# Patient Record
Sex: Male | Born: 1950 | Race: White | Hispanic: No | Marital: Married | Smoking: Never smoker
Health system: Southern US, Community
[De-identification: ages and names within clinical notes are randomized; demographics above are authoritative.]

## PROBLEM LIST (undated history)

## (undated) DIAGNOSIS — G40309 Generalized idiopathic epilepsy and epileptic syndromes, not intractable, without status epilepticus: Secondary | ICD-10-CM

## (undated) DIAGNOSIS — M1990 Unspecified osteoarthritis, unspecified site: Secondary | ICD-10-CM

## (undated) DIAGNOSIS — F419 Anxiety disorder, unspecified: Secondary | ICD-10-CM

## (undated) DIAGNOSIS — G4769 Other sleep related movement disorders: Secondary | ICD-10-CM

## (undated) DIAGNOSIS — I491 Atrial premature depolarization: Secondary | ICD-10-CM

## (undated) DIAGNOSIS — I10 Essential (primary) hypertension: Secondary | ICD-10-CM

## (undated) DIAGNOSIS — R569 Unspecified convulsions: Secondary | ICD-10-CM

## (undated) DIAGNOSIS — E669 Obesity, unspecified: Secondary | ICD-10-CM

## (undated) HISTORY — PX: ADENOIDECTOMY: SUR15

## (undated) HISTORY — DX: Essential (primary) hypertension: I10

## (undated) HISTORY — DX: Obesity, unspecified: E66.9

## (undated) HISTORY — DX: Other sleep related movement disorders: G47.69

## (undated) HISTORY — PX: APPENDECTOMY: SHX54

## (undated) HISTORY — DX: Generalized idiopathic epilepsy and epileptic syndromes, not intractable, without status epilepticus: G40.309

## (undated) HISTORY — PX: HIP ARTHROPLASTY: SHX981

## (undated) HISTORY — PX: TONSILLECTOMY: SUR1361

---

## 1987-05-19 HISTORY — PX: CHOLECYSTECTOMY: SHX55

## 1999-05-19 HISTORY — PX: LUMBAR FUSION: SHX111

## 1999-05-19 HISTORY — PX: KNEE ARTHROSCOPY: SUR90

## 2008-01-09 ENCOUNTER — Emergency Department (HOSPITAL_COMMUNITY): Admission: EM | Admit: 2008-01-09 | Discharge: 2008-01-09 | Payer: Self-pay | Admitting: Emergency Medicine

## 2008-01-14 ENCOUNTER — Encounter: Admission: RE | Admit: 2008-01-14 | Discharge: 2008-01-14 | Payer: Self-pay | Admitting: Specialist

## 2008-04-10 ENCOUNTER — Encounter: Admission: RE | Admit: 2008-04-10 | Discharge: 2008-04-10 | Payer: Self-pay | Admitting: Gastroenterology

## 2011-11-16 ENCOUNTER — Emergency Department (HOSPITAL_COMMUNITY): Payer: PRIVATE HEALTH INSURANCE

## 2011-11-16 ENCOUNTER — Emergency Department (HOSPITAL_COMMUNITY)
Admission: EM | Admit: 2011-11-16 | Discharge: 2011-11-16 | Disposition: A | Discharge status: Home / Self Care | Payer: PRIVATE HEALTH INSURANCE | Attending: Emergency Medicine | Admitting: Emergency Medicine

## 2011-11-16 ENCOUNTER — Encounter (HOSPITAL_COMMUNITY): Payer: Self-pay

## 2011-11-16 DIAGNOSIS — S40011A Contusion of right shoulder, initial encounter: Secondary | ICD-10-CM

## 2011-11-16 DIAGNOSIS — S7000XA Contusion of unspecified hip, initial encounter: Secondary | ICD-10-CM | POA: Insufficient documentation

## 2011-11-16 DIAGNOSIS — M169 Osteoarthritis of hip, unspecified: Secondary | ICD-10-CM | POA: Insufficient documentation

## 2011-11-16 DIAGNOSIS — W010XXA Fall on same level from slipping, tripping and stumbling without subsequent striking against object, initial encounter: Secondary | ICD-10-CM | POA: Insufficient documentation

## 2011-11-16 DIAGNOSIS — S40019A Contusion of unspecified shoulder, initial encounter: Secondary | ICD-10-CM | POA: Insufficient documentation

## 2011-11-16 DIAGNOSIS — W19XXXA Unspecified fall, initial encounter: Secondary | ICD-10-CM

## 2011-11-16 DIAGNOSIS — M542 Cervicalgia: Secondary | ICD-10-CM | POA: Insufficient documentation

## 2011-11-16 DIAGNOSIS — M1610 Unilateral primary osteoarthritis, unspecified hip: Secondary | ICD-10-CM | POA: Insufficient documentation

## 2011-11-16 DIAGNOSIS — S7001XA Contusion of right hip, initial encounter: Secondary | ICD-10-CM

## 2011-11-16 HISTORY — DX: Atrial premature depolarization: I49.1

## 2011-11-16 HISTORY — DX: Unspecified convulsions: R56.9

## 2011-11-16 MED ORDER — OXYCODONE-ACETAMINOPHEN 5-325 MG PO TABS: 1.0000 | ORAL_TABLET | ORAL | Status: AC | PRN

## 2011-11-16 MED ORDER — MORPHINE SULFATE 4 MG/ML IJ SOLN
4.0000 mg | Freq: Once | INTRAMUSCULAR | Status: AC
  Administered 2011-11-16: 4 mg via INTRAVENOUS
  Filled 2011-11-16: qty 1

## 2011-11-16 NOTE — ED Notes (Signed)
Pt was brought in by ambulance S/P slipped and fall with c/o neck pain, rt shoulder pain and rt hip pain. Pt denies hitting his head on the floor. Pt is immobilized on a back board but no C-collar, EMS stated that his neck is wide and shirt to fit one. Pt is A/A/Ox4, skin is warm and dry, respiration is even and unlabored.

## 2011-11-16 NOTE — ED Notes (Signed)
Pt ambulated fine at first was holding the wall then I had him let go and he ambulated fine. York Spaniel was a little dizzy but that was from meds. 1:51pm JG.

## 2011-11-16 NOTE — Discharge Instructions (Signed)
Contusion A contusion is a deep bruise. Contusions are the result of an injury that caused bleeding under the skin. The contusion may turn blue, purple, or yellow. Minor injuries will give you a painless contusion, but more severe contusions may stay painful and swollen for a few weeks.  CAUSES  A contusion is usually caused by a blow, trauma, or direct force to an area of the body. SYMPTOMS   Swelling and redness of the injured area.   Bruising of the injured area.   Tenderness and soreness of the injured area.   Pain.  DIAGNOSIS  The diagnosis can be made by taking a history and physical exam. An X-ray, CT scan, or MRI may be needed to determine if there were any associated injuries, such as fractures. TREATMENT  Specific treatment will depend on what area of the body was injured. In general, the best treatment for a contusion is resting, icing, elevating, and applying cold compresses to the injured area. Over-the-counter medicines may also be recommended for pain control. Ask your caregiver what the best treatment is for your contusion. HOME CARE INSTRUCTIONS   Put ice on the injured area.   Put ice in a plastic bag.   Place a towel between your skin and the bag.   Leave the ice on for 15 to 20 minutes, 3 to 4 times a day.   Only take over-the-counter or prescription medicines for pain, discomfort, or fever as directed by your caregiver. Your caregiver may recommend avoiding anti-inflammatory medicines (aspirin, ibuprofen, and naproxen) for 48 hours because these medicines may increase bruising.   Rest the injured area.   If possible, elevate the injured area to reduce swelling.  SEEK IMMEDIATE MEDICAL CARE IF:   You have increased bruising or swelling.   You have pain that is getting worse.   Your swelling or pain is not relieved with medicines.  MAKE SURE YOU:   Understand these instructions.   Will watch your condition.   Will get help right away if you are not  doing well or get worse.  Document Released: 02/11/2005 Document Revised: 04/23/2011 Document Reviewed: 03/09/2011 [facility] Patient Information 2012 [place], [place].  Home Safety and Preventing Falls Falls are a leading cause of injury and while they affect all age groups, falls have greater short-term and long-term impact on older age groups. However, falls should not be a part of life or aging. It is possible for individuals and their families to use preventive measures to significantly decrease the likelihood that anyone, especially an older adult, will fall. There are many simple measures which can make your home safer with respect to preventing falls. The following actions can help reduce falls among all members of your family and are especially important as you age, when your balance, lower limb strength, coordination, and eyesight may be declining. The use of preventive measures will help to reduce you and your family's risk of falls and serious medical consequences. OUTDOORS  Repair cracks and edges of walkways and driveways.   Remove high doorway thresholds and trim shrubbery on the main path into your home.   Ensure there is good outside lighting at main entrances and along main walkways.   Clear walkways of tools, rocks, debris, and clutter.   Check that handrails are not broken and are securely fastened. Both sides of steps should have handrails.   In the garage, be attentive to and clean up grease or oil spills on the cement. This can make the surface  extremely slippery.   In winter, have leaves, snow, and ice cleared regularly.   Use sand or salt on walkways during winter months.  BATHROOM  Install grab bars by the toilet and in the tub and shower.   Use non-skid mats or decals in the tub or shower.   If unable to easily stand unsupported while showering, place a plastic non slip stool in the shower to sit on when needed.   Install night lights.   Keep floors dry and  clean up all water on the floor immediately.   Remove soap buildup in tub or shower on a regular basis.   Secure bath mats with non-slip, double-sided rug tape.   Remove tripping hazards from the floors.  BEDROOMS  Install night lights.   Do not use oversized bedding.   Make sure a bedside light is easy to reach.   Keep a telephone by your bedside.   Make sure that you can get in and out of your bed easily.   Have a firm chair, with side arms, to use for getting dressed.   Remove clutter from around closets.   Store clothing, bed coverings, and other household items where you can reach them comfortably.   Remove tripping hazards from the floor.  LIVING AREAS AND STAIRWAYS  Turn on lights to avoid having to walk through dark areas.   Keep lighting uniform in each room. Place brighter lightbulbs in darker areas, including stairways.   Replace lightbulbs that burn out in stairways immediately.   Arrange furniture to provide for clear pathways.   Keep furniture in the same place.   Eliminate or tape down electrical cables in high traffic areas.   Place handrails on both sides of stairways. Use handrails when going up or down stairs.   Most falls occur on the top or bottom 3 steps.   Fix any loose handrails. Make sure handrails on both sides of the stairways are as long as the stairs.   Remove all walkway obstacles.   Coil or tape electrical cords off to the side of walking areas and out of the way. If using many extension cords, have an electrician put in a new wall outlet to reduce or eliminate them.   Make sure spills are cleaned up quickly and allow time for drying before walking on freshly cleaned floors.   Firmly attach carpet with non-skid or two-sided tape.   Keep frequently used items within easy reach.   Remove tripping hazards such as throw rugs and clutter in walkways. Never leave objects on stairs.   Get rid of throw rugs elsewhere if possible.    Eliminate uneven floor surfaces.   Make sure couches and chairs are easy to get into and out of.   Check carpeting to make sure it is firmly attached along stairs.   Make repairs to worn or loose carpet promptly.   Select a carpet pattern that does not visually hide the edge of steps.   Avoid placing throw rugs or scatter rugs at the top or bottom of stairways, or properly secure with carpet tape to prevent slippage.   Have an electrician put in a light switch at the top and bottom of the stairs.   Get light switches that glow.   Avoid the following practices: hurrying, inattention, obscured vision, carrying large loads, and wearing slip-on shoes.   Be aware of all pets.  KITCHEN  Place items that are used frequently, such as dishes and food, within  easy reach.   Keep handles on pots and pans toward the center of the stove. Use back burners when possible.   Make sure spills are cleaned up quickly and allow time for drying.   Avoid walking on wet floors.   Avoid hot utensils and knives.   Position shelves so they are not too high or low.   Place commonly used objects within easy reach.   If necessary, use a sturdy step stool with a grab bar when reaching.   Make sure electrical cables are out of the way.   Do not use floor polish or wax that makes floors slippery.  OTHER HOME FALL PREVENTION STRATEGIES  Wear low heel or rubber sole shoes that are supportive and fit well.   Wear closed toe shoes.   Know and watch for side effects of medications. Have your caregiver or pharmacist look at all your medicines, even over-the-counter medicines. Some medicines can make you sleepy or dizzy.   Exercise regularly. Exercise makes you stronger and improves your balance and coordination.   Limit use of alcohol.   Use eyeglasses if necessary and keep them clean. Have your vision checked every year.   Organize your household in a manner that minimizes the need to walk  distances when hurried, or go up and down stairs unnecessarily. For example, have a phone placed on at least each floor of your home. If possible, have a phone beside each sitting or lying area where you spend the most time at home. Keep emergency numbers posted at all phones.   Use non-skid floor wax.   When using a ladder, make sure:   The base is firm.   All ladder feet are on level ground.   The ladder is angled against the wall properly.   When climbing a ladder, face the ladder and hold the ladder rungs firmly.   If reaching, always keep your hips and body weight centered between the rails.   When using a stepladder, make sure it is fully opened and both spreaders are firmly locked.   Do not climb a closed stepladder.   Avoid climbing beyond the second step from the top of a stepladder and the 4th rung from the top of an extension ladder.   Learn and use mobility aids as needed.   Change positions slowly. Arise slowly from sitting and lying positions. Sit on the edge of your bed before getting to your feet.   If you have a history of falls, ask someone to add color or contrast paint or tape to grab bars and handrails in your home.   If you have a history of falls, ask someone to place contrasting color strips on first and last steps.   Install an electrical emergency response system if you need one, and know how to use it.   If you have a medical or other condition that causes you to have limited physical strength, it is important that you reach out to family and friends for occasional help.  FOR CHILDREN:  If young children are in the home, use safety gates. At the top of stairs use screw-mounted gates; use pressure-mounted gates for the bottom of the stairs and doorways between rooms.   Young children should be taught to descend stairs on their stomachs, feet first, and later using the handrail.   Keep drawers fully closed to prevent them from being climbed on or pulled  out entirely.   Move chairs, cribs, beds and other furniture  away from windows.   Consider installing window guards on windows ground floor and up, unless they are emergency fire exits. Make sure they have easy release mechanisms.   Consider installing special locks that only allow the window to be opened to a certain height.   Never rely on window screens to prevent falls.   Never leave babies alone on changing tables, beds or sofas. Use a changing table that has a restraining strap.   When a child can pull to a standing position, the crib mattress should be adjusted to its lowest position. There should be at least 26 inches between the top rails of the crib drop side and the mattress. Toys, bumper pads, and other objects that can be used as steps to climb out should be removed from the crib.   On bunk beds never allow a child under age 21 to sleep on the top bunk. For older children, if the upper bunk is not against a wall, use guard rails on both sides. No matter how old a child is, keep the guard rails in place on the top bunk since children roll during sleep. Do not permit horseplay on bunks.   Grass and soil surfaces beneath backyard playground equipment should be replaced with hardwood chips, shredded wood mulch, sand, pea gravel, rubber, crushed stone, or another safer material at depths of at least 9 to 12 inches.   When riding bikes or using skates, skateboards, skis, or snowboards, require children to wear helmets. Look for those that have stickers stating that they meet or exceed safety standards.   Vertical posts or pickets in deck, balcony, and stairway railings should be no more than 3 1/2 inches apart if a young baby will have access to the area. The space between horizontal rails or bars, and between the floor and the first horizontal rail or bar, should be no more than 3 1/2 inches.  Document Released: 04/24/2002 Document Revised: 04/23/2011 Document Reviewed:  02/21/2009 [facility] Patient Information 2012 [place], [place].  Walker Use HOW TO TELL IF A WALKER IS THE RIGHT SIZE  With your arms hanging at your sides, the walker handles should be at wrist level. If you cannot find the exact fit, choose the height that is most comfortable.   If you have been instructed to not place weight on one of your legs, you may feel more comfortable with a shorter height. If you are using the walker for balance, you may prefer a taller height.   Adjust the height by using the push buttons on the legs of your walker.   In rest position, the back leg of the walkers should be no further ahead than your toes. With your hands resting on the grips, your elbows should be slightly bent at about a 30 degree angle.   Ask your physical therapist or caregiver if you have any concerns.  HOW TO USE A STANDARD WALKER (NO WHEELS)  Pick your walker up (do not slide your walker) and place it one step length in front of you. The back legs of the walker should be no further ahead than your toes. You should not feel like you need to lean forward to keep your hands on the grips. As you set the walker down, make sure all 4 leg tips contact the ground at the same time.   Hold onto the walker for support and step forward with your weaker leg into the middle of the walker. Follow the weight bearing instructions your  caregiver has given you.   Push down with your hands and step forward with your stronger leg.   Be careful not to let the walker get too far ahead of you as you walk.   Repeat the process for each step.  HOW TO USE A FRONT-WHEELED WALKER  Slide your walker forward. The back legs of the walker should be no further ahead than your toes. You should not feel like you need to lean forward to keep your hands on the grips.   Hold onto the walker for support and step forward with your weaker leg into the middle of the walker. Follow the weight bearing instructions your caregiver  has given you.   Push down with your hands and step forward with your stronger leg.   Be careful not to let the walker get too far ahead of you as you walk.   Repeat the process for each step.   If your walker does not glide well over carpet, consider cutting an "x" in 2 old tennis balls and placing them over the back legs of your walker.  STANDING UP FROM A CHAIR WITH ARMRESTS  It is best to sit in a firm chair with armrests.   Position your walker directly in front of your chair. Do not pull on the walker when standing up. It is too unstable to support weight when pulled on.   Slide forward in the chair, with your weaker leg ahead and stronger leg bent near the chair.   Lean forward and push up from your chair with both hands on the armrests. Straighten your stronger leg, rising to standing. Do not pull yourself up from the walker. This may cause it to tip.   When you feel steady on your feet, carefully move one hand at a time to the walker.   Stand for a few seconds to stabilize your balance before you start to walk.  STANDING UP FROM A CHAIR WITHOUT ARMRESTS  It is best to sit in a firm chair. A low seat or an overstuffed chair or sofa is hard to get out of.   Place the walker in front of you. Do not pull on the walker when coming to a standing position.   Slide forward in the chair, with your weaker leg ahead and stronger leg bent near the chair.   Push down on the chair seat with the hand opposite your weaker leg. Keep your other hand on the center of the walker's crossbar.   Stand, steady your balance, and place your hands on the walker handgrips.  SITTING DOWN  Always back up toward your chair, using your walker, until you feel the back of your legs touch the chair.   If the chair has armrests, carefully reach back to put your hands on the armrests, and slowly lower your weight.   If the chair does not have armrests, consider backing up to the side of the chair. You can  then hold onto the back of the chair and the front of the seat to slowly lower yourself.   You should never feel like you are falling into your chair.  USING A WALKER ON STEPS   Before attempting to use your walker on steps, practice with your physical therapist.   If you are going up a step wide enough to accommodate the entire walker and yourself:   First, place the walker up on the step.   Second, get your feet as close to the  step as you can.   Third, press down on the walker with your hands as you step up with your stronger leg. Then step up with your weaker leg.   If you are going down a step wide enough to accommodate the entire walker and yourself:   First, place the walker down on the step.   Second, hold onto the walker as you step down with your weaker leg. Then step down with your stronger leg.   If you are going up more than 1 step and have a railing:   First, turn the walker sideways, so the opening is facing in toward you.   Second, place the front 2 legs of the walker on the first step. These front legs should be positioned at the base of the next step.   Third, test the steadiness of the walker. It should feel sturdy when you press down on the handgrip that is facing the top of the steps.   Finally, placing your weight on the railing and the walker, step up with your stronger leg first. Then step up with your weaker leg.   If you are going down more than 1 step and have a railing:   First, turn the walker sideways, so the opening is facing in toward you.   Second, place the front 2 legs of the walker down on the first step. When possible, the back legs of the walker should be positioned at the base of the previous step.   Third, test the steadiness of the walker. It should feel sturdy when you press down on the handgrip that is facing the top of the steps.   Finally, placing your weight on the railing and the walker, step down with your weaker leg first. Then  step down with your stronger leg.   Be sure to check the sturdiness of the walker before each step.   Make sure you have good rubber tips on the legs of your walker to prevent it from slipping.  Document Released: 05/04/2005 Document Revised: 04/23/2011 Document Reviewed: 11/11/2010 [facility] Patient Information 2012 [place], [place].  Acetaminophen; Oxycodone tablets What is this medicine? ACETAMINOPHEN; OXYCODONE (a set a MEE noe fen; ox i KOE done) is a pain reliever. It is used to treat mild to moderate pain. This medicine may be used for other purposes; ask your health care provider or pharmacist if you have questions. What should I tell my health care provider before I take this medicine? They need to know if you have any of these conditions: -brain tumor -Crohn's disease, inflammatory bowel disease, or ulcerative colitis -drink more than 3 alcohol containing drinks per day -drug abuse or addiction -head injury -heart or circulation problems -kidney disease or problems going to the bathroom -liver disease -lung disease, asthma, or breathing problems -an unusual or allergic reaction to acetaminophen, oxycodone, other opioid analgesics, other medicines, foods, dyes, or preservatives -pregnant or trying to get pregnant -breast-feeding How should I use this medicine? Take this medicine by mouth with a full glass of water. Follow the directions on the prescription label. Take your medicine at regular intervals. Do not take your medicine more often than directed. Talk to your pediatrician regarding the use of this medicine in children. Special care may be needed. Patients over 35 years old may have a stronger reaction and need a smaller dose. Overdosage: If you think you have taken too much of this medicine contact a poison control center or emergency room at once. NOTE:  This medicine is only for you. Do not share this medicine with others. What if I miss a dose? If you miss a dose,  take it as soon as you can. If it is almost time for your next dose, take only that dose. Do not take double or extra doses. What may interact with this medicine? -alcohol or medicines that contain alcohol -antihistamines -barbiturates like amobarbital, butalbital, butabarbital, methohexital, pentobarbital, phenobarbital, thiopental, and secobarbital -benztropine -drugs for bladder problems like solifenacin, trospium, oxybutynin, tolterodine, hyoscyamine, and methscopolamine -drugs for breathing problems like ipratropium and tiotropium -drugs for certain stomach or intestine problems like propantheline, homatropine methylbromide, glycopyrrolate, atropine, belladonna, and dicyclomine -general anesthetics like etomidate, ketamine, nitrous oxide, propofol, desflurane, enflurane, halothane, isoflurane, and sevoflurane -medicines for depression, anxiety, or psychotic disturbances -medicines for pain like codeine, morphine, pentazocine, buprenorphine, butorphanol, nalbuphine, tramadol, and propoxyphene -medicines for sleep -muscle relaxants -naltrexone -phenothiazines like perphenazine, thioridazine, chlorpromazine, mesoridazine, fluphenazine, prochlorperazine, promazine, and trifluoperazine -scopolamine -trihexyphenidyl This list may not describe all possible interactions. Give your health care provider a list of all the medicines, herbs, non-prescription drugs, or dietary supplements you use. Also tell them if you smoke, drink alcohol, or use illegal drugs. Some items may interact with your medicine. What should I watch for while using this medicine? Tell your doctor or health care professional if your pain does not go away, if it gets worse, or if you have new or a different type of pain. You may develop tolerance to the medicine. Tolerance means that you will need a higher dose of the medication for pain relief. Tolerance is normal and is expected if you take this medicine for a long time. Do not  suddenly stop taking your medicine because you may develop a severe reaction. Your body becomes used to the medicine. This does NOT mean you are addicted. Addiction is a behavior related to getting and using a drug for a nonmedical reason. If you have pain, you have a medical reason to take pain medicine. Your doctor will tell you how much medicine to take. If your doctor wants you to stop the medicine, the dose will be slowly lowered over time to avoid any side effects. You may get drowsy or dizzy. Do not drive, use machinery, or do anything that needs mental alertness until you know how this medicine affects you. Do not stand or sit up quickly, especially if you are an older patient. This reduces the risk of dizzy or fainting spells. Alcohol may interfere with the effect of this medicine. Avoid alcoholic drinks. The medicine will cause constipation. Try to have a bowel movement at least every 2 to 3 days. If you do not have a bowel movement for 3 days, call your doctor or health care professional. Do not take Tylenol (acetaminophen) or medicines that have acetaminophen with this medicine. Too much acetaminophen can be very dangerous. Many nonprescription medicines contain acetaminophen. Always read the labels carefully to avoid taking more acetaminophen. What side effects may I notice from receiving this medicine? Side effects that you should report to your doctor or health care professional as soon as possible: -allergic reactions like skin rash, itching or hives, swelling of the face, lips, or tongue -breathing difficulties, wheezing -confusion -light headedness or fainting spells -severe stomach pain -yellowing of the skin or the whites of the eyes Side effects that usually do not require medical attention (report to your doctor or health care professional if they continue or are bothersome): -dizziness -drowsiness -nausea -vomiting This  list may not describe all possible side effects. Call  your doctor for medical advice about side effects. You may report side effects to FDA at 1-800-FDA-1088. Where should I keep my medicine? Keep out of the reach of children. This medicine can be abused. Keep your medicine in a safe place to protect it from theft. Do not share this medicine with anyone. Selling or giving away this medicine is dangerous and against the law. Store at room temperature between 20 and 25 degrees C (68 and 77 degrees F). Keep container tightly closed. Protect from light. Flush any unused medicines down the toilet. Do not use the medicine after the expiration date. NOTE: This sheet is a summary. It may not cover all possible information. If you have questions about this medicine, talk to your doctor, pharmacist, or health care provider.  2012, [employer]. (04/02/2008 10:01:21 AM)

## 2011-11-16 NOTE — ED Notes (Signed)
Dr. Glick at bedside.  

## 2011-11-16 NOTE — ED Notes (Signed)
CT of the neck is negative, Dr. Preston Fleeting was made aware, back board was removed.

## 2011-11-16 NOTE — ED Provider Notes (Signed)
History     CSN: [id number]  Arrival date & time 11/16/11  0714   First MD Initiated Contact with Patient 11/16/11 0715      Chief Complaint  Patient presents with  . Fall    (Consider location/radiation/quality/duration/timing/severity/associated sxs/prior treatment) Patient is a 61 y.o. male presenting with fall. The history is provided by the patient.  Fall  He slipped and fell landing on his right side. He is complaining of pain in the right side of his neck, his right shoulder, and his right groin. Pain is moderate to severe and he rates it at 7/10. He did have some numbness in his right arm initially but that has resolved. He was treated by EMS by full spinal immobilization but c-collar could not be applied because of his neck size.  No past medical history on file.  No past surgical history on file.  No family history on file.  History  Substance Use Topics  . Smoking status: Not on file  . Smokeless tobacco: Not on file  . Alcohol Use: Not on file      Review of Systems  All other systems reviewed and are negative.    Allergies  Review of patient's allergies indicates not on file.  Home Medications  No current outpatient prescriptions on file.  There were no vitals taken for this visit.  Physical Exam  Nursing note and vitals reviewed.  61 year old white male on a long spine board. Vital signs are normal. He is obese, but in no acute distress. Head is normocephalic and atraumatic. PERRLA, EOMI. Neck is nontender. Back is nontender. Lungs are clear without rales, wheezes, rhonchi. Heart has regular rate rhythm without murmur. There is no chest wall tenderness. Abdomen is soft, flat, nontender without masses or hepatosplenomegaly. Extremities: There is tenderness palpation in the right shoulder with worse tenderness over the region of the a.c. joint. There is tenderness palpation over the right anterior pelvis and over the right hip with pain on range of motion  of the right leg. Distal neurovascular exam is intact in both right arm and right leg with strong pulses, normal sensation, and prompt capillary refill. No other extremity injuries seen. Skin is warm and dry without rash. Neurologic: Mental status is normal, cranial nerves are intact, there are no motor or sensory deficits.  ED Course  Procedures (including critical care time)  Dg Shoulder Right  11/16/2011  *RADIOLOGY REPORT*  Clinical Data: Recent fall with pain  RIGHT SHOULDER - 2+ VIEW  Comparison: None.  Findings: There is age advanced degenerative joint disease of the right shoulder.  There is loss of joint space, sclerosis, and spurring present digitally from the inferior humeral head and inferior labrum.  No acute fracture is seen.  The right [facility] joint is normally aligned with no abnormality evident.  IMPRESSION: Age advanced degenerative joint disease of the right shoulder.  Original Report Authenticated By: Juline Patch, M.D.   Dg Hip Complete Right  11/16/2011  *RADIOLOGY REPORT*  Clinical Data: Recent fall with pain  RIGHT HIP - COMPLETE 2+ VIEW  Comparison: Right hip films of 03/12/2010  Findings: There has been progression of degenerative joint disease involving the right hip with loss of joint space particularly superiorly.  There is sclerosis, spurring, and subchondral cyst formation present.  No acute fracture is seen.  Minimal degenerative joint disease involves the left hip.  The pelvic rami are intact.  The SI joints appear normal.  Hardware for fusion is noted in  the lower lumbar spine.  There is a linear lucency vertically through the transverse bar of the fixation device overlying the L5 level of questionable significance.  This linear lucency was present on the films of 2011.  IMPRESSION:   Progression of the advanced degenerative joint disease of the right hip.  No acute fracture.  Original Report Authenticated By: Juline Patch, M.D.   Ct Cervical Spine Wo Contrast  11/16/2011   *RADIOLOGY REPORT*  Clinical Data: Posterior neck pain post fall  CT CERVICAL SPINE WITHOUT CONTRAST  Technique:  Multidetector CT imaging of the cervical spine was performed. Multiplanar CT image reconstructions were also generated.  Comparison: 01/09/2008  Findings: Axial images of the cervical spine shows no acute fracture or subluxation.  There is no pneumothorax in visualized lung apices.  Computer processed images shows no acute fracture or subluxation. Again noted disc space flattening with mild anterior and mild posterior spurring at C4-C5 level.  Stable disc space flattening with mild anterior and moderate posterior spurring at C5-C6 level. Minimal disc space flattening at C7-T1 level.  There is mild disc space flattening with mild anterior and minimal posterior spurring at C6-C7 level. No prevertebral soft tissue swelling.  Facet degenerative changes noted at C6 and C7 level.  Cervical airway is patent.  IMPRESSION: No acute fracture or subluxation.  Degenerative changes as described above.  Original Report Authenticated By: Natasha Mead, M.D.   Ct Hip Right Wo Contrast  11/16/2011  *RADIOLOGY REPORT*  Clinical Data: Persistent right hip pain since a fall.  CT OF THE RIGHT HIP WITHOUT CONTRAST  Technique:  Multidetector CT imaging was performed according to the standard protocol. Multiplanar CT image reconstructions were also generated.  Comparison: CT pelvis 04/10/2008.  Plain films of the right hip 03/12/2010 and 11/16/2011.  Findings: No fracture or dislocation is identified.  Since the prior CT scan, there has been marked progression of degenerative disease about the right hip.  There is bone-on-bone joint space narrowing with extensive subchondral sclerosis, cyst formation and osteophytosis.  No hip joint effusion is identified. Milder degree of left hip degenerative disease is present.  Visualized musculature appears normal.  Imaged intrapelvic contents are unremarkable.  IMPRESSION: Negative for  fracture of acute finding.  Severe right hip osteoarthritis.  Original Report Authenticated By: Bernadene Bell. D'ALESSIO, M.D.     1. Fall   2. Contusion of hip, right   3. Contusion of shoulder, right       MDM  Fall with neck, shoulder, and hip injuries. X-rays of been ordered.  X-rays are read as negative for fracture. However, he has extreme pain with any range of motion in his right hip. CT will be obtained to evaluate possible occult fracture.  CT scan is negative for fracture but shows severe degenerative changes. He is able to ambulate but with some difficulty. He is given a prescription for a walker and prescription for Percocet for pain.        Dione Booze, MD 11/16/11 1359

## 2011-12-25 ENCOUNTER — Ambulatory Visit
Admission: RE | Admit: 2011-12-25 | Discharge: 2011-12-25 | Disposition: A | Discharge status: Home / Self Care | Payer: PRIVATE HEALTH INSURANCE | Source: Ambulatory Visit | Attending: Family Medicine | Admitting: Family Medicine

## 2011-12-25 ENCOUNTER — Other Ambulatory Visit: Payer: Self-pay | Admitting: Family Medicine

## 2011-12-25 DIAGNOSIS — Z01818 Encounter for other preprocedural examination: Secondary | ICD-10-CM

## 2012-01-11 MED ORDER — ASPIRIN 81 MG PO CHEW
CHEWABLE_TABLET | ORAL | Status: AC
  Filled 2012-01-11: qty 1

## 2012-01-12 ENCOUNTER — Encounter (HOSPITAL_COMMUNITY): Payer: Self-pay | Admitting: Respiratory Therapy

## 2012-01-14 ENCOUNTER — Inpatient Hospital Stay (HOSPITAL_COMMUNITY): Admission: RE | Admit: 2012-01-14 | Payer: PRIVATE HEALTH INSURANCE | Source: Ambulatory Visit

## 2012-01-22 ENCOUNTER — Encounter (HOSPITAL_COMMUNITY): Admission: RE | Payer: Self-pay | Source: Ambulatory Visit

## 2012-01-22 ENCOUNTER — Ambulatory Visit (HOSPITAL_COMMUNITY)
Admission: RE | Admit: 2012-01-22 | Payer: PRIVATE HEALTH INSURANCE | Source: Ambulatory Visit | Admitting: Orthopedic Surgery

## 2012-01-22 SURGERY — ARTHROPLASTY, SHOULDER, TOTAL
Anesthesia: General | Site: Shoulder | Laterality: Right

## 2012-02-16 NOTE — H&P (Signed)
CC: right shoulder pain HPI: 61 y/o male with worsening right shoulder pain secondary to endstage osteoarthritis. Pt has elected for a hemi arthroplasty to increase function and decrease pain to right shoulder PMH: seizures, anxiety, cardiac arrhythmmias,  Family: CVA, osteoarthritis Social: no alcohol use, non smoker, married Meds: arthrotec, robaxin, percocet Allergies: contrast, penicillin ROS: pain with ros of right shoulder otherwise ros negative PE: alert and appropriate 61 y/o male in no acute distress Cervical spine: full rom cranial nerves 2-12 intact Right shoulder: moderate crepitus with rom, nv intact distally, mild decrease rom due to pain Left upper extremity shows full rom no tenderness No rashes or edema X-rays: right shoulder endstage osteoarthritis Assessment: right shoulder endstage osteoarthritis Plan: right shoulder hemi arthroplasty with CTA head

## 2012-02-17 ENCOUNTER — Encounter (HOSPITAL_COMMUNITY)
Admission: RE | Admit: 2012-02-17 | Discharge: 2012-02-17 | Disposition: A | Discharge status: Home / Self Care | Payer: PRIVATE HEALTH INSURANCE | Source: Ambulatory Visit | Attending: Orthopedic Surgery | Admitting: Orthopedic Surgery

## 2012-02-17 ENCOUNTER — Encounter (HOSPITAL_COMMUNITY): Payer: Self-pay

## 2012-02-17 HISTORY — DX: Anxiety disorder, unspecified: F41.9

## 2012-02-17 HISTORY — DX: Unspecified osteoarthritis, unspecified site: M19.90

## 2012-02-17 LAB — CBC
MCH: 31.5 pg (ref 26.0–34.0)
MCHC: 32.9 g/dL (ref 30.0–36.0)
Platelets: 334 10*3/uL (ref 150–400)
RBC: 4.38 MIL/uL (ref 4.22–5.81)
RDW: 13.7 % (ref 11.5–15.5)

## 2012-02-17 LAB — BASIC METABOLIC PANEL
Calcium: 9.5 mg/dL (ref 8.4–10.5)
GFR calc non Af Amer: >90 mL/min (ref >90)
Glucose, Bld: 112 mg/dL — ABNORMAL HIGH (ref 70–99)
Sodium: 137 mEq/L (ref 135–145)

## 2012-02-17 LAB — TYPE AND SCREEN: ABO/RH(D): A POS

## 2012-02-17 LAB — ABO/RH: ABO/RH(D): A POS

## 2012-02-17 NOTE — Progress Notes (Signed)
Requested EKG from [facility]

## 2012-02-17 NOTE — Pre-Procedure Instructions (Signed)
20 Richard Johnson  02/17/2012   Your procedure is scheduled on:  October 4  Report to [facility] at 05:30 AM.  Call this number if you have problems the morning of surgery: [phone]   Remember:   Do not eat or drink:After Midnight.  Take these medicines the morning of surgery with A SIP OF WATER: Klonopin, Lexapro, Metoprolol, Oxycodone, Dilantin,     STOP Vitamin C, Robaxin, Ibuprofen today  Do not wear jewelry, make-up or nail polish.  Do not wear lotions, powders, or perfumes. You may wear deodorant.  Do not shave 48 hours prior to surgery. Men may shave face and neck.  Do not bring valuables to the hospital.  Contacts, dentures or bridgework may not be worn into surgery.  Leave suitcase in the car. After surgery it may be brought to your room.  For patients admitted to the hospital, checkout time is 11:00 AM the day of discharge.   Special Instructions: Incentive Spirometry - Practice and bring it with you on the day of surgery. Shower using CHG 2 nights before surgery and the night before surgery.  If you shower the day of surgery use CHG.  Use special wash - you have one bottle of CHG for all showers.  You should use approximately 1/3 of the bottle for each shower.   Please read over the following fact sheets that you were given: Pain Booklet, Coughing and Deep Breathing, Blood Transfusion Information and Surgical Site Infection Prevention

## 2012-02-18 MED ORDER — VANCOMYCIN HCL 1000 MG IV SOLR
1500.0000 mg | INTRAVENOUS | Status: AC
  Administered 2012-02-19: 1500 mg via INTRAVENOUS
  Filled 2012-02-18: qty 1500

## 2012-02-19 ENCOUNTER — Ambulatory Visit (HOSPITAL_COMMUNITY): Payer: PRIVATE HEALTH INSURANCE | Admitting: Anesthesiology

## 2012-02-19 ENCOUNTER — Encounter (HOSPITAL_COMMUNITY): Payer: Self-pay | Admitting: Surgery

## 2012-02-19 ENCOUNTER — Inpatient Hospital Stay (HOSPITAL_COMMUNITY)
Admission: RE | Admit: 2012-02-19 | Discharge: 2012-02-20 | DRG: 484 | Disposition: A | Discharge status: Home / Self Care | Payer: PRIVATE HEALTH INSURANCE | Source: Ambulatory Visit | Attending: Orthopedic Surgery | Admitting: Orthopedic Surgery

## 2012-02-19 ENCOUNTER — Encounter (HOSPITAL_COMMUNITY): Payer: Self-pay | Admitting: Anesthesiology

## 2012-02-19 ENCOUNTER — Encounter (HOSPITAL_COMMUNITY)
Admission: RE | Disposition: A | Discharge status: Home / Self Care | Payer: Self-pay | Source: Ambulatory Visit | Attending: Orthopedic Surgery

## 2012-02-19 ENCOUNTER — Inpatient Hospital Stay (HOSPITAL_COMMUNITY): Payer: PRIVATE HEALTH INSURANCE

## 2012-02-19 DIAGNOSIS — Z96619 Presence of unspecified artificial shoulder joint: Secondary | ICD-10-CM

## 2012-02-19 DIAGNOSIS — G40909 Epilepsy, unspecified, not intractable, without status epilepticus: Secondary | ICD-10-CM | POA: Diagnosis present

## 2012-02-19 DIAGNOSIS — M19019 Primary osteoarthritis, unspecified shoulder: Principal | ICD-10-CM | POA: Diagnosis present

## 2012-02-19 DIAGNOSIS — Z880 Allergy status to penicillin: Secondary | ICD-10-CM

## 2012-02-19 DIAGNOSIS — F411 Generalized anxiety disorder: Secondary | ICD-10-CM | POA: Diagnosis present

## 2012-02-19 DIAGNOSIS — Z823 Family history of stroke: Secondary | ICD-10-CM

## 2012-02-19 HISTORY — PX: REVERSE SHOULDER ARTHROPLASTY: SHX5054

## 2012-02-19 SURGERY — ARTHROPLASTY, SHOULDER, TOTAL, REVERSE
Anesthesia: General | Site: Shoulder | Laterality: Right | Wound class: Clean

## 2012-02-19 MED ORDER — PROPOFOL 10 MG/ML IV BOLUS
INTRAVENOUS | Status: DC | PRN
  Administered 2012-02-19: 200 mg via INTRAVENOUS

## 2012-02-19 MED ORDER — SIMVASTATIN 20 MG PO TABS
20.0000 mg | ORAL_TABLET | Freq: Every evening | ORAL | Status: DC
  Administered 2012-02-19: 20 mg via ORAL
  Filled 2012-02-19 (×3): qty 1

## 2012-02-19 MED ORDER — KETOROLAC TROMETHAMINE 30 MG/ML IJ SOLN
INTRAMUSCULAR | Status: DC | PRN
  Administered 2012-02-19: 30 mg via INTRAVENOUS

## 2012-02-19 MED ORDER — ONDANSETRON HCL 4 MG/2ML IJ SOLN: 4.0000 mg | Freq: Four times a day (QID) | INTRAMUSCULAR | Status: DC | PRN

## 2012-02-19 MED ORDER — MENTHOL 3 MG MT LOZG: 1.0000 | LOZENGE | OROMUCOSAL | Status: DC | PRN

## 2012-02-19 MED ORDER — SULFASALAZINE 500 MG PO TABS
500.0000 mg | ORAL_TABLET | Freq: Two times a day (BID) | ORAL | Status: DC
Start: 2012-02-19 — End: 2012-02-20
  Administered 2012-02-19 – 2012-02-20 (×2): 500 mg via ORAL
  Filled 2012-02-19 (×3): qty 1

## 2012-02-19 MED ORDER — ONDANSETRON HCL 4 MG PO TABS: 4.0000 mg | ORAL_TABLET | Freq: Four times a day (QID) | ORAL | Status: DC | PRN

## 2012-02-19 MED ORDER — SODIUM CHLORIDE 0.9 % IV SOLN
10.0000 mg | INTRAVENOUS | Status: DC | PRN
  Administered 2012-02-19: 10 ug/min via INTRAVENOUS

## 2012-02-19 MED ORDER — METOPROLOL SUCCINATE ER 25 MG PO TB24
25.0000 mg | ORAL_TABLET | Freq: Every day | ORAL | Status: DC
  Administered 2012-02-20: 25 mg via ORAL
  Filled 2012-02-19: qty 1

## 2012-02-19 MED ORDER — PHENYTOIN SODIUM EXTENDED 100 MG PO CAPS: 200.0000 mg | ORAL_CAPSULE | Freq: Two times a day (BID) | ORAL | Status: DC

## 2012-02-19 MED ORDER — ACETAMINOPHEN 325 MG PO TABS: 650.0000 mg | ORAL_TABLET | Freq: Four times a day (QID) | ORAL | Status: DC | PRN

## 2012-02-19 MED ORDER — MORPHINE SULFATE 4 MG/ML IJ SOLN
4.0000 mg | INTRAMUSCULAR | Status: DC | PRN
  Administered 2012-02-19 – 2012-02-20 (×5): 4 mg via INTRAVENOUS
  Filled 2012-02-19 (×5): qty 1

## 2012-02-19 MED ORDER — OXYCODONE-ACETAMINOPHEN 5-325 MG PO TABS: 1.0000 | ORAL_TABLET | ORAL | Status: DC | PRN

## 2012-02-19 MED ORDER — METHOCARBAMOL 500 MG PO TABS: 500.0000 mg | ORAL_TABLET | Freq: Four times a day (QID) | ORAL | Status: DC

## 2012-02-19 MED ORDER — ROPIVACAINE HCL 5 MG/ML IJ SOLN
INTRAMUSCULAR | Status: DC | PRN
  Administered 2012-02-19: 150 mg via EPIDURAL

## 2012-02-19 MED ORDER — FENTANYL CITRATE 0.05 MG/ML IJ SOLN
INTRAMUSCULAR | Status: DC | PRN
  Administered 2012-02-19: 100 ug via INTRAVENOUS

## 2012-02-19 MED ORDER — ACETAMINOPHEN 650 MG RE SUPP: 650.0000 mg | Freq: Four times a day (QID) | RECTAL | Status: DC | PRN

## 2012-02-19 MED ORDER — PHENYTOIN SODIUM EXTENDED 100 MG PO CAPS
200.0000 mg | ORAL_CAPSULE | Freq: Every day | ORAL | Status: DC
  Filled 2012-02-19: qty 2

## 2012-02-19 MED ORDER — METOCLOPRAMIDE HCL 5 MG/ML IJ SOLN: 5.0000 mg | Freq: Three times a day (TID) | INTRAMUSCULAR | Status: DC | PRN

## 2012-02-19 MED ORDER — METOCLOPRAMIDE HCL 10 MG PO TABS
5.0000 mg | ORAL_TABLET | Freq: Three times a day (TID) | ORAL | Status: DC | PRN
Start: 2012-02-19 — End: 2012-02-20

## 2012-02-19 MED ORDER — METHOCARBAMOL 500 MG PO TABS: 500.0000 mg | ORAL_TABLET | Freq: Three times a day (TID) | ORAL | Status: DC | PRN

## 2012-02-19 MED ORDER — VANCOMYCIN HCL IN DEXTROSE 1-5 GM/200ML-% IV SOLN
1000.0000 mg | Freq: Two times a day (BID) | INTRAVENOUS | Status: AC
  Administered 2012-02-19: 1000 mg via INTRAVENOUS
  Filled 2012-02-19: qty 200

## 2012-02-19 MED ORDER — SODIUM CHLORIDE 0.9 % IR SOLN
Status: DC | PRN
  Administered 2012-02-19: 1000 mL

## 2012-02-19 MED ORDER — BISACODYL 10 MG RE SUPP: 10.0000 mg | Freq: Every day | RECTAL | Status: DC | PRN

## 2012-02-19 MED ORDER — MIDAZOLAM HCL 5 MG/5ML IJ SOLN
INTRAMUSCULAR | Status: DC | PRN
  Administered 2012-02-19: 2 mg via INTRAVENOUS

## 2012-02-19 MED ORDER — SODIUM CHLORIDE 0.9 % IV SOLN
INTRAVENOUS | Status: DC
  Administered 2012-02-19: 12:00:00 via INTRAVENOUS

## 2012-02-19 MED ORDER — BUPIVACAINE-EPINEPHRINE PF 0.25-1:200000 % IJ SOLN
INTRAMUSCULAR | Status: AC
  Filled 2012-02-19: qty 30

## 2012-02-19 MED ORDER — SODIUM CHLORIDE 0.9 % IV SOLN
INTRAVENOUS | Status: DC | PRN
  Administered 2012-02-19: 08:00:00 via INTRAVENOUS

## 2012-02-19 MED ORDER — PHENYTOIN SODIUM EXTENDED 100 MG PO CAPS
200.0000 mg | ORAL_CAPSULE | Freq: Every day | ORAL | Status: DC
  Administered 2012-02-20: 200 mg via ORAL
  Filled 2012-02-19: qty 2

## 2012-02-19 MED ORDER — DEXAMETHASONE SODIUM PHOSPHATE 4 MG/ML IJ SOLN
INTRAMUSCULAR | Status: DC | PRN
  Administered 2012-02-19: 4 mg

## 2012-02-19 MED ORDER — PHENOL 1.4 % MT LIQD: 1.0000 | OROMUCOSAL | Status: DC | PRN

## 2012-02-19 MED ORDER — PHENYLEPHRINE HCL 10 MG/ML IJ SOLN
INTRAMUSCULAR | Status: DC | PRN
  Administered 2012-02-19 (×4): 80 ug via INTRAVENOUS

## 2012-02-19 MED ORDER — LACTATED RINGERS IV SOLN
INTRAVENOUS | Status: DC | PRN
  Administered 2012-02-19: 07:00:00 via INTRAVENOUS

## 2012-02-19 MED ORDER — METHOCARBAMOL 500 MG PO TABS
500.0000 mg | ORAL_TABLET | Freq: Four times a day (QID) | ORAL | Status: DC | PRN
  Administered 2012-02-19 – 2012-02-20 (×3): 500 mg via ORAL
  Filled 2012-02-19 (×3): qty 1

## 2012-02-19 MED ORDER — METHOCARBAMOL 100 MG/ML IJ SOLN
500.0000 mg | Freq: Four times a day (QID) | INTRAVENOUS | Status: DC | PRN
  Filled 2012-02-19: qty 5

## 2012-02-19 MED ORDER — PHENYTOIN SODIUM EXTENDED 100 MG PO CAPS
300.0000 mg | ORAL_CAPSULE | Freq: Every day | ORAL | Status: DC
  Filled 2012-02-19: qty 3

## 2012-02-19 MED ORDER — ROCURONIUM BROMIDE 100 MG/10ML IV SOLN
INTRAVENOUS | Status: DC | PRN
  Administered 2012-02-19: 30 mg via INTRAVENOUS

## 2012-02-19 MED ORDER — OXYCODONE HCL 5 MG PO TABS
5.0000 mg | ORAL_TABLET | Freq: Two times a day (BID) | ORAL | Status: DC | PRN
  Administered 2012-02-19: 5 mg via ORAL
  Filled 2012-02-19 (×2): qty 1

## 2012-02-19 MED ORDER — BUPIVACAINE-EPINEPHRINE 0.25% -1:200000 IJ SOLN
INTRAMUSCULAR | Status: DC | PRN
  Administered 2012-02-19: 10 mL

## 2012-02-19 MED ORDER — INSULIN ASPART 100 UNIT/ML ~~LOC~~ SOLN
SUBCUTANEOUS | Status: AC
  Filled 2012-02-19: qty 1

## 2012-02-19 MED ORDER — ESCITALOPRAM OXALATE 20 MG PO TABS
20.0000 mg | ORAL_TABLET | Freq: Every day | ORAL | Status: DC
  Administered 2012-02-20: 20 mg via ORAL
  Filled 2012-02-19: qty 1

## 2012-02-19 MED ORDER — CHLORHEXIDINE GLUCONATE 4 % EX LIQD: 60.0000 mL | Freq: Once | CUTANEOUS | Status: DC

## 2012-02-19 MED ORDER — VITAMIN C 500 MG PO TABS
500.0000 mg | ORAL_TABLET | Freq: Every day | ORAL | Status: DC
Start: 2012-02-19 — End: 2012-02-20
  Administered 2012-02-19 – 2012-02-20 (×2): 500 mg via ORAL
  Filled 2012-02-19 (×3): qty 1

## 2012-02-19 MED ORDER — CLONAZEPAM 0.5 MG PO TABS: 0.5000 mg | ORAL_TABLET | Freq: Two times a day (BID) | ORAL | Status: DC | PRN

## 2012-02-19 SURGICAL SUPPLY — 79 items
BLADE SAW SAG 73X25 THK (BLADE) ×1
BLADE SAW SGTL 73X25 THK (BLADE) ×1 IMPLANT
BOWL SMART MIX CTS (DISPOSABLE) IMPLANT
CLOTH BEACON ORANGE TIMEOUT ST (SAFETY) ×2 IMPLANT
CLSR STERI-STRIP ANTIMIC 1/2X4 (GAUZE/BANDAGES/DRESSINGS) ×2 IMPLANT
COVER SURGICAL LIGHT HANDLE (MISCELLANEOUS) ×2 IMPLANT
CUP HUMERAL 42 PLUS 3 (Orthopedic Implant) ×2 IMPLANT
DRAPE INCISE IOBAN 66X45 STRL (DRAPES) ×2 IMPLANT
DRAPE U-SHAPE 47X51 STRL (DRAPES) ×2 IMPLANT
DRILL BIT 5/64 (BIT) IMPLANT
DRSG ADAPTIC 3X8 NADH LF (GAUZE/BANDAGES/DRESSINGS) IMPLANT
DRSG PAD ABDOMINAL 8X10 ST (GAUZE/BANDAGES/DRESSINGS) IMPLANT
DURAPREP 26ML APPLICATOR (WOUND CARE) ×2 IMPLANT
ECCENTRIC EPIPHYSI MODULAR SZ1 (Trauma) ×1 IMPLANT
ELECT NEEDLE TIP 2.8 STRL (NEEDLE) IMPLANT
ELECT REM PT RETURN 9FT ADLT (ELECTROSURGICAL) ×2
ELECTRODE REM PT RTRN 9FT ADLT (ELECTROSURGICAL) ×1 IMPLANT
GLENOSPHERE ECC 42 (Orthopedic Implant) ×2 IMPLANT
GLOVE BIOGEL PI IND STRL 8 (GLOVE) ×1 IMPLANT
GLOVE BIOGEL PI INDICATOR 8 (GLOVE) ×1
GLOVE BIOGEL PI ORTHO PRO 7.5 (GLOVE) ×1
GLOVE BIOGEL PI ORTHO PRO SZ7 (GLOVE) ×2
GLOVE BIOGEL PI ORTHO PRO SZ8 (GLOVE) ×1
GLOVE ORTHO TXT STRL SZ7.5 (GLOVE) ×2 IMPLANT
GLOVE PI ORTHO PRO STRL 7.5 (GLOVE) ×1 IMPLANT
GLOVE PI ORTHO PRO STRL SZ7 (GLOVE) ×2 IMPLANT
GLOVE PI ORTHO PRO STRL SZ8 (GLOVE) ×1 IMPLANT
GLOVE SURG ORTHO 8.5 STRL (GLOVE) ×2 IMPLANT
GLOVE SURG SS PI 7.0 STRL IVOR (GLOVE) ×2 IMPLANT
GLOVE SURG SS PI 7.5 STRL IVOR (GLOVE) ×2 IMPLANT
GOWN STRL NON-REIN LRG LVL3 (GOWN DISPOSABLE) IMPLANT
GOWN STRL REIN XL XLG (GOWN DISPOSABLE) ×12 IMPLANT
KIT BASIN OR (CUSTOM PROCEDURE TRAY) ×2 IMPLANT
KIT ROOM TURNOVER OR (KITS) ×2 IMPLANT
MANIFOLD NEPTUNE II (INSTRUMENTS) ×2 IMPLANT
METAGLENE DELTA EXTEND (Trauma) ×1 IMPLANT
METAGLENE DXTEND (Trauma) ×2 IMPLANT
MODULAR ECCENTRIC EPIPHYSI SZ1 (Trauma) ×2 IMPLANT
NDL SUT .5 MAYO 1.404X.05X (NEEDLE) ×1 IMPLANT
NDL SUT 6 .5 CRC .975X.05 MAYO (NEEDLE) IMPLANT
NEEDLE 1/2 CIR MAYO (NEEDLE) ×2 IMPLANT
NEEDLE HYPO 25GX1X1/2 BEV (NEEDLE) ×2 IMPLANT
NEEDLE MAYO TAPER (NEEDLE) ×1
NS IRRIG 1000ML POUR BTL (IV SOLUTION) ×2 IMPLANT
PACK SHOULDER (CUSTOM PROCEDURE TRAY) ×2 IMPLANT
PAD ARMBOARD 7.5X6 YLW CONV (MISCELLANEOUS) ×4 IMPLANT
PASSER SUT SWANSON 36MM LOOP (INSTRUMENTS) IMPLANT
SCREW 4.5X18MM (Screw) ×1 IMPLANT
SCREW 4.5X24MM (Screw) ×1 IMPLANT
SCREW 4.5X36MM (Screw) ×2 IMPLANT
SCREW 48L (Screw) ×2 IMPLANT
SCREW BN 18X4.5XSTRL SHLDR (Screw) ×1 IMPLANT
SCREW BN 24X4.5XLCK STRL (Screw) ×1 IMPLANT
SCREW LOCK DELTA XTEND 4.5X30 (Screw) ×2 IMPLANT
SLING ARM FOAM STRAP LRG (SOFTGOODS) ×2 IMPLANT
SLING ARM IMMOBILIZER LRG (SOFTGOODS) IMPLANT
SLING ARM IMMOBILIZER MED (SOFTGOODS) IMPLANT
SPONGE GAUZE 4X4 12PLY (GAUZE/BANDAGES/DRESSINGS) IMPLANT
SPONGE LAP 18X18 X RAY DECT (DISPOSABLE) ×2 IMPLANT
STEM 12 HA (Stem) ×2 IMPLANT
STRIP CLOSURE SKIN 1/2X4 (GAUZE/BANDAGES/DRESSINGS) IMPLANT
SUCTION FRAZIER TIP 10 FR DISP (SUCTIONS) ×2 IMPLANT
SUT FIBERWIRE #2 38 T-5 BLUE (SUTURE) ×10
SUT MNCRL AB 4-0 PS2 18 (SUTURE) ×2 IMPLANT
SUT SILK 2 0 TIES 17X18 (SUTURE)
SUT SILK 2-0 18XBRD TIE BLK (SUTURE) IMPLANT
SUT VIC AB 0 CT1 27 (SUTURE) ×2
SUT VIC AB 0 CT1 27XBRD ANBCTR (SUTURE) ×2 IMPLANT
SUT VIC AB 2-0 CT1 27 (SUTURE) ×1
SUT VIC AB 2-0 CT1 TAPERPNT 27 (SUTURE) ×1 IMPLANT
SUTURE FIBERWR #2 38 T-5 BLUE (SUTURE) ×5 IMPLANT
SWABSTICK BENZOIN STERILE (MISCELLANEOUS) IMPLANT
SYR CONTROL 10ML LL (SYRINGE) ×2 IMPLANT
TAPE CLOTH SURG 6X10 WHT LF (GAUZE/BANDAGES/DRESSINGS) ×2 IMPLANT
TOWEL OR 17X24 6PK STRL BLUE (TOWEL DISPOSABLE) ×2 IMPLANT
TOWEL OR 17X26 10 PK STRL BLUE (TOWEL DISPOSABLE) ×2 IMPLANT
TRAY FOLEY CATH 14FR (SET/KITS/TRAYS/PACK) IMPLANT
WATER STERILE IRR 1000ML POUR (IV SOLUTION) ×2 IMPLANT
YANKAUER SUCT BULB TIP NO VENT (SUCTIONS) ×2 IMPLANT

## 2012-02-19 NOTE — Interval H&P Note (Signed)
History and Physical Interval Note:  02/19/2012 7:30 AM  Richard Johnson  has presented today for surgery, with the diagnosis of right shoulder Osteoarthritis   The various methods of treatment have been discussed with the patient and family. After consideration of risks, benefits and other options for treatment, the patient has consented to right shoulder reverse total shoulder replacement as a surgical intervention .  We discussed the relative advantages and disadvantages of the reverse versus the shoulder CTA hemi-arthroplasty and he wants to proceed with the reverse for more predictable pain relief. The patient's history has been reviewed, patient examined, no change in status, stable for surgery.  I have reviewed the patient's chart and labs.  Questions were answered to the patient's satisfaction.     Iszabella Hebenstreit,STEVEN R

## 2012-02-19 NOTE — Progress Notes (Signed)
Pt states that he was told that he would have a shoulder replacement and believed it was a total.  He signed Hemi- on preadmit.  Dr. Ranell Patrick paged and notified of pt concerns. Dr. Ranell Patrick instructed for pt to not have sedation until he speaks w/ pt [id number].  Note applied to front of chart.

## 2012-02-19 NOTE — Transfer of Care (Signed)
Immediate Anesthesia Transfer of Care Note  Patient: Richard Johnson  Procedure(s) Performed: Procedure(s) (LRB) with comments: REVERSE SHOULDER ARTHROPLASTY (Right) - right total reverse arthroplasty  Patient Location: PACU  Anesthesia Type: General  Level of Consciousness: awake, alert  and oriented  Airway & Oxygen Therapy: Patient Spontanous Breathing and Patient connected to nasal cannula oxygen  Post-op Assessment: Report given to PACU RN, Post -op Vital signs reviewed and stable and Patient moving all extremities  Post vital signs: Reviewed and stable  Complications: No apparent anesthesia complications

## 2012-02-19 NOTE — Brief Op Note (Signed)
02/19/2012  11:11 AM  PATIENT:  Richard Johnson  61 y.o. male  PRE-OPERATIVE DIAGNOSIS:  right shoulder Osteoarthritis, end stage, with rotator cuff insufficiency  POST-OPERATIVE DIAGNOSIS:  right shoulder Osteoarthritis, end stage, with rotator cuff insufficiency PROCEDURE:  Procedure(s) (LRB) with comments: REVERSE SHOULDER ARTHROPLASTY (Right) - right total reverse arthroplasty  SURGEON:  Surgeon(s) and Role:    * Verlee Rossetti, MD - Primary  PHYSICIAN ASSISTANT:   ASSISTANTS: Thea Gist, PA-C   ANESTHESIA:   regional and general  EBL:  Total I/O In: 1300 [I.V.:1300] Out: 100 [Blood:100]  BLOOD ADMINISTERED:none  DRAINS: none   LOCAL MEDICATIONS USED:  MARCAINE     SPECIMEN:  No Specimen  DISPOSITION OF SPECIMEN:  N/A  COUNTS:  YES  TOURNIQUET:  * No tourniquets in log *  DICTATION: .Other Dictation: Dictation Number [id number]  PLAN OF CARE: Admit to inpatient   PATIENT DISPOSITION:  PACU - hemodynamically stable.   Delay start of Pharmacological VTE agent (>24hrs) due to surgical blood loss or risk of bleeding: not applicable

## 2012-02-19 NOTE — Preoperative (Signed)
Beta Blockers   Reason not to administer Beta Blockers:Not Applicable 

## 2012-02-19 NOTE — Progress Notes (Signed)
UR COMPLETED  

## 2012-02-19 NOTE — Anesthesia Postprocedure Evaluation (Signed)
  Anesthesia Post-op Note  Patient: Richard Johnson  Procedure(s) Performed: Procedure(s) (LRB) with comments: REVERSE SHOULDER ARTHROPLASTY (Right) - right total reverse arthroplasty  Patient Location: PACU  Anesthesia Type: General and GA combined with regional for post-op pain  Level of Consciousness: awake, alert  and oriented  Airway and Oxygen Therapy: Patient Spontanous Breathing and Patient connected to nasal cannula oxygen  Post-op Pain: none  Post-op Assessment: Post-op Vital signs reviewed and Patient's Cardiovascular Status Stable  Post-op Vital Signs: stable  Complications: No apparent anesthesia complications

## 2012-02-19 NOTE — Anesthesia Preprocedure Evaluation (Addendum)
Anesthesia Evaluation  Patient identified by MRN, date of birth, ID band Patient awake    Reviewed: Allergy & Precautions, H&P , NPO status , Patient's Chart, lab work & pertinent test results, reviewed documented beta blocker date and time   Airway Mallampati: II TM Distance: >3 FB Neck ROM: Full    Dental  (+) Teeth Intact and Dental Advisory Given   Pulmonary  breath sounds clear to auscultation        Cardiovascular + dysrhythmias Rhythm:Regular Rate:Normal     Neuro/Psych Seizures -, Well Controlled,  PSYCHIATRIC DISORDERS Anxiety    GI/Hepatic negative GI ROS, Neg liver ROS,   Endo/Other  Morbid obesity  Renal/GU negative Renal ROS     Musculoskeletal   Abdominal   Peds  Hematology   Anesthesia Other Findings   Reproductive/Obstetrics                           Anesthesia Physical Anesthesia Plan  ASA: III  Anesthesia Plan: General   Post-op Pain Management:    Induction: Intravenous  Airway Management Planned: Oral ETT  Additional Equipment:   Intra-op Plan:   Post-operative Plan: Extubation in OR  Informed Consent: I have reviewed the patients History and Physical, chart, labs and discussed the procedure including the risks, benefits and alternatives for the proposed anesthesia with the patient or authorized representative who has indicated his/her understanding and acceptance.   Dental advisory given  Plan Discussed with: CRNA, Anesthesiologist and Surgeon  Anesthesia Plan Comments:         Anesthesia Quick Evaluation

## 2012-02-19 NOTE — Anesthesia Procedure Notes (Addendum)
Anesthesia Regional Block:  Interscalene brachial plexus block  Pre-Anesthetic Checklist: ,, timeout performed, Correct Patient, Correct Site, Correct Laterality, Correct Procedure,, site marked, risks and benefits discussed, Surgical consent,  Pre-op evaluation,  At surgeon's request and post-op pain management  Laterality: Right  Prep: chloraprep       Needles:  Injection technique: Single-shot  Needle Type: Echogenic Stimulator Needle     Needle Length: 5cm 5 cm Needle Gauge: 22 and 22 G    Additional Needles:  Procedures: ultrasound guided and nerve stimulator Interscalene brachial plexus block  Nerve Stimulator or Paresthesia:  Response: bicep contraction, 0.45 mA,   Additional Responses:   Narrative:  Start time: 02/19/2012 7:30 AM End time: 02/19/2012 7:41 AM Injection made incrementally with aspirations every 5 mL.  Performed by: Personally  Anesthesiologist: J. Adonis Huguenin, MD  Additional Notes: Functioning IV was confirmed and monitors applied.  A 50mm 22ga echogenic arrow stimulator was used. Sterile prep and drape,hand hygiene and sterile gloves were used.Ultrasound guidance: relevant anatomy identified, needle position confirmed, local anesthetic spread visualized around nerve(s)., vascular puncture avoided.  Image printed for medical record.  Negative aspiration and negative test dose prior to incremental administration of local anesthetic. The patient tolerated the procedure well.  Interscalene brachial plexus block Procedure Name: Intubation Date/Time: 02/19/2012 7:53 AM Performed by: Sharlene Dory E Pre-anesthesia Checklist: Patient identified, Emergency Drugs available, Suction available, Patient being monitored and Timeout performed Patient Re-evaluated:Patient Re-evaluated prior to inductionOxygen Delivery Method: Circle system utilized Preoxygenation: Pre-oxygenation with 100% oxygen Intubation Type: IV induction Ventilation: Mask ventilation without  difficulty Laryngoscope Size: Mac and 4 Grade View: Grade I Tube type: Oral Tube size: 7.5 mm Number of attempts: 1 Airway Equipment and Method: Stylet Placement Confirmation: ETT inserted through vocal cords under direct vision,  positive ETCO2 and breath sounds checked- equal and bilateral Secured at: 22 cm Tube secured with: Tape Dental Injury: Teeth and Oropharynx as per pre-operative assessment  Comments: Neck remained in neutral cervical alignment for intubation.

## 2012-02-20 LAB — BASIC METABOLIC PANEL
BUN: 17 mg/dL (ref 6–23)
Calcium: 8.5 mg/dL (ref 8.4–10.5)
GFR calc Af Amer: >90 mL/min (ref >90)
GFR calc non Af Amer: >90 mL/min (ref >90)
Glucose, Bld: 142 mg/dL — ABNORMAL HIGH (ref 70–99)
Sodium: 134 mEq/L — ABNORMAL LOW (ref 135–145)

## 2012-02-20 MED ORDER — POLYETHYLENE GLYCOL 3350 17 G PO PACK: 17.0000 g | PACK | Freq: Every day | ORAL | Status: DC

## 2012-02-20 MED ORDER — OXYCODONE HCL 5 MG PO TABS
5.0000 mg | ORAL_TABLET | ORAL | Status: DC | PRN
  Administered 2012-02-20: 5 mg via ORAL
  Administered 2012-02-20: 10 mg via ORAL
  Filled 2012-02-20: qty 2

## 2012-02-20 NOTE — Progress Notes (Signed)
Occupational Therapy Treatment Patient Details Name: Richard Johnson MRN: [id number] DOB: 06-03-1950 Today's Date: 02/20/2012 Time: 7829-5621 OT Time Calculation (min): 35 min  OT Assessment / Plan / Recommendation Comments on Treatment Session Excellent participation. will need to continue with outpt OT for RUE rehab per Dr. Ranell Patrick protocol. Written information given.     Follow Up Recommendations  Outpatient OT    Barriers to Discharge  None    Equipment Recommendations  None recommended by OT    Recommendations for Other Services    Frequency Min 2X/week   Plan Discharge plan remains appropriate    Precautions / Restrictions Precautions Precautions: Shoulder Type of Shoulder Precautions: norris protocol Precaution Booklet Issued: Yes (comment) Required Braces or Orthoses: Other Brace/Splint (sling for comfort) Restrictions Weight Bearing Restrictions: Yes RUE Weight Bearing: Non weight bearing   Pertinent Vitals/Pain 4. Ice after session.    ADL  Eating/Feeding: Performed;Independent Where Assessed - Eating/Feeding: Chair Grooming: Simulated;Minimal assistance Where Assessed - Grooming: Unsupported standing Upper Body Bathing: Performed;Minimal assistance Where Assessed - Upper Body Bathing: Unsupported sit to stand Lower Body Bathing: Simulated;Minimal assistance Where Assessed - Lower Body Bathing: Unsupported sit to stand Upper Body Dressing: Simulated;Maximal assistance Where Assessed - Upper Body Dressing: Unsupported sitting Lower Body Dressing: Simulated;Minimal assistance Where Assessed - Lower Body Dressing: Unsupported sit to stand Toilet Transfer: Simulated;Supervision/safety Toilet Transfer Method: Sit to Barista: Comfort height toilet Equipment Used: Other (comment) (sling) Transfers/Ambulation Related to ADLs: S ADL Comments: Educated wife/pt on compensatory techniques for ADL. Complted education on exercises, including  pendulums, AAROM FF to 90 and ER to tolerance and NWB status RUE. Also educated on donning/doffing sling and sling wearing schedule for comfort and shen out in community.    OT Diagnosis: Generalized weakness;Acute pain  OT Problem List: Decreased strength;Decreased range of motion;Decreased coordination;Decreased safety awareness;Decreased knowledge of precautions;Impaired sensation;Obesity;Pain;Impaired UE functional use;Increased edema OT Treatment Interventions: Self-care/ADL training;Therapeutic exercise;Therapeutic activities;Patient/family education   OT Goals Acute Rehab OT Goals OT Goal Formulation: With patient Time For Goal Achievement: 02/27/12 Potential to Achieve Goals: Good ADL Goals Pt Will Perform Upper Body Bathing: with caregiver independent in assisting;Sitting, chair;with min assist;Unsupported ADL Goal: Upper Body Bathing - Progress: Met Pt Will Perform Upper Body Dressing: with min assist;with caregiver independent in assisting;Sitting, bed;Unsupported ADL Goal: Upper Body Dressing - Progress: Met Additional ADL Goal #1: Pt/wife will be independent in donning/doffing sling and sling wearing time/purpose (for comfort). ADL Goal: Additional Goal #1 - Progress: Met Arm Goals Additional Arm Goal #1: Pt/wife will be independent with written HEP, including pendulums,  and AAROM for FF and ER. Arm Goal: Additional Goal #1 - Progress: Met Additional Arm Goal #2: Pt/wife will verbally express importance of NWB status for RUE. Arm Goal: Additional Goal #2 - Progress: Met  Visit Information  Last OT Received On: 02/20/12 Assistance Needed: +1    Subjective Data   thank you so much   Prior Functioning  Home Living Lives With: Spouse Available Help at Discharge: Available 24 hours/day Type of Home: Apartment Home Access: Stairs to enter [employer] of Steps: 5 Entrance Stairs-Rails: None Home Layout: One level Bathroom Shower/Tub: Investment banker, corporate: Standard Bathroom Accessibility: Yes How Accessible: Accessible via walker Home Adaptive Equipment: Walker - rolling Prior Function Level of Independence: Independent Able to Take Stairs?: Yes Driving: Yes Vocation: Retired Musician: No difficulties Dominant Hand: Right    Cognition  Overall Cognitive Status: Appears within functional limits for tasks assessed/performed Arousal/Alertness:  Awake/alert Orientation Level: Appears intact for tasks assessed Behavior During Session: [facility] for tasks performed    Mobility  Shoulder Instructions Bed Mobility Bed Mobility: Supine to Sit;Sit to Supine Supine to Sit: 5: Supervision Sit to Supine: 5: Supervision Details for Bed Mobility Assistance: understands log rollintoward nonoperated side Transfers Transfers: Sit to Stand;Stand to Sit Sit to Stand: 6: Modified independent (Device/Increase time) Stand to Sit: 6: Modified independent (Device/Increase time) Details for Transfer Assistance: vc for NWB RUE   Donning/doffing shirt without moving shoulder: Caregiver independent with task;Patient able to independently direct caregiver Method for sponge bathing under operated UE: Caregiver independent with task;Patient able to independently direct caregiver Donning/doffing sling/immobilizer: Caregiver independent with task;Patient able to independently direct caregiver Correct positioning of sling/immobilizer: Caregiver independent with task;Patient able to independently direct caregiver Pendulum exercises (written home exercise program): Modified independent ROM for elbow, wrist and digits of operated UE: Caregiver independent with task;Patient able to independently direct caregiver;Modified independent Sling wearing schedule (on at all times/off for ADL's): Patient able to independently direct caregiver;Independent Proper positioning of operated UE when showering: Independent Positioning of UE  while sleeping: Independent;Patient able to independently direct caregiver   Exercises  Shoulder Exercises Pendulum Exercise: AAROM;Right;5 reps;Standing Elbow Flexion: AAROM;Right;10 reps;Standing Elbow Extension: AAROM;Right;10 reps;Seated   Balance Balance Balance Assessed:  (WFL)   End of Session OT - End of Session Equipment Utilized During Treatment: Other (comment) (sling) Activity Tolerance: Patient tolerated treatment well Patient left: in chair;with call bell/phone within reach;with family/visitor present Nurse Communication: Other (comment) (ready for D/C)  GO     Richard Johnson,Richard Johnson 02/20/2012, 12:23 PM [facility], OTR/L  [phone] 02/20/2012

## 2012-02-20 NOTE — Progress Notes (Addendum)
    Subjective: 1 Day Post-Op Procedure(s) (LRB): REVERSE SHOULDER ARTHROPLASTY (Right) Patient reports pain as 4 on 0-10 scale.   Denies CP or SOB.  Voiding without difficulty. Positive flatus. Objective: Vital signs in last 24 hours: Temp:  [97 F (36.1 C)-99.6 F (37.6 C)] 98.6 F (37 C) (10/05 0531) Pulse Rate:  [66-98] 98  (10/05 0531) Resp:  [15-25] 18  (10/05 0531) BP: (103-119)/(56-91) 109/69 mmHg (10/05 0531) SpO2:  [92 %-97 %] 94 % (10/05 0531) Weight:  [120 kg (264 lb 8.8 oz)] 120 kg (264 lb 8.8 oz) (10/04 1522)  Intake/Output from previous day: 10/04 0701 - 10/05 0700 In: 2346.3 [P.O.:340; I.V.:2006.3] Out: 700 [Urine:600; Blood:100] Intake/Output this shift:    Labs:  Basename 02/20/12 0630 02/17/12 0941  HGB 13.1 13.8    Basename 02/20/12 0630 02/17/12 0941  WBC -- 9.2  RBC -- 4.38  HCT 40.1 41.9  PLT -- 334    Basename 02/20/12 0630 02/17/12 0941  NA 134* 137  K 4.1 4.5  CL 100 102  CO2 25 23  BUN 17 12  CREATININE 0.81 0.80  GLUCOSE 142* 112*  CALCIUM 8.5 9.5   No results found for this basename: LABPT:2,INR:2 in the last 72 hours  Physical Exam: Neurologically intact Neurovascular intact Incision: dressing C/D/I Physical exam findings are per evaluation of patient by Dr. Shon Baton  Assessment/Plan: 1 Day Post-Op Procedure(s) (LRB): REVERSE SHOULDER ARTHROPLASTY (Right) Advance diet Up with therapy Discharge home  [place] for Dr. Venita Lick [facility] [phone] 02/20/2012, 9:05 AM

## 2012-02-20 NOTE — Op Note (Signed)
Richard Johnson, Richard Johnson                 ACCOUNT NO.:  [id number]  MEDICAL RECORD NO.:  [id number]  LOCATION:  5N06C                        FACILITY:  [facility]  PHYSICIAN:  Almedia Balls. Ranell Patrick, M.D. 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  PROCEDURE PERFORMED:  Right reverse total shoulder arthroplasty.  ATTENDING SURGEON:  Almedia Balls. Ranell Patrick, MD  ASSISTANT:  Donnie Coffin. Dixon, PA-C, who scrubbed during the entire procedure and necessary for satisfactory completion of surgery.  ANESTHESIA:  General anesthesia was used plus interscalene block.  ESTIMATED BLOOD LOSS:  200 mL.  FLUID REPLACEMENT:  1200 mL of crystalloid.  INSTRUMENT COUNTS:  Correct.  COMPLICATIONS:  There were no complications.  ANTIBIOTICS:  Perioperative antibiotics were given.  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  The patient has clinical examination consistent with severe shoulder arthritis and rotator cuff insufficiency.  X-rays demonstrating advanced arthritic changes with bone-on-bone and large osteophytes present throughout the shoulder joint.  MRI scan demonstrating advanced atrophy in the subscapularis and infraspinatus muscle areas and thinning of the rotator cuff tendons and as well as end- stage arthritis.  Having failed all measures of conservative management, we discussed options for surgery including potential shoulder hemiarthroplasty with a CTA head versus reverse shoulder arthroplasty. Risks and benefits of both options were discussed.  The patient elected to proceed with reverse total shoulder  arthroplasty.  Informed consent was obtained.  DESCRIPTION OF PROCEDURE:  After an adequate level of anesthesia was achieved, the patient was positioned on the modified beach-chair position.  The right shoulder had basically no range of motion including forward elevation may be 20-30 degrees, internal and external rotation 10 degree arc.  After sterile prep and drape of shoulder and arm, we entered the shoulder using standard deltopectoral incision.  After time- out had been called, dissection was down through the subcutaneous tissues using Bovie electrocautery.  Identified the cephalic vein, took it laterally to the deltoid, pectoralis taken medially.  The conjoined tendon was retracted medially as well.  Subscapularis was released off the lesser tuberosity.  Neck release performed.  Care was taken towards protecting the axillary nerve.  The inferior osteophytes off the humerus was released, again freed up little bit more motion.  We then were able to deliver the shoulder out of the wound.  The anterior supraspinatus area was just totally thinned, degenerative and torn.  We released a little bit as just for better visualization.  We then entered the proximal humerus with the reamers.  We reamed up to a size 12 diameter. We then placed the intramedullary guide for the neck and head resection, set on 10 degrees retroversion for the reverse total shoulder arthroplasty from [place]uy.  We made that cut with the oscillating saw.  We removed all excess bone spurs.  We then milled the proximal humerus with a size 1 epi, right shoulder.  We prepared that metaphyseal portion.  We impacted the trial component.  We then subluxed the humerus posteriorly, performed a 360-degree glenoid release of the labrum.  There was also a large bursa present inferiorly and anteriorly, which were removed using the osteotomes.  Once we had defined the glenoid, we placed a central guidepin and reamed for the metaglene.   We then drilled out the central peg hole for the metaglene, impacting the metaglene in position.  We placed a total of three locked screws, a 48 inferiorly, 36 into the coracoid base, and a 24 anteriorly with an 18 nonlocked posteriorly.  We then placed a 42 eccentric glenosphere and tightened that in position, pointed inferiorly.  We had good coverage over the scapula.  We then went ahead and trialed with a 42+ 3 poly.  We were happy with soft tissue balancing and good nice tight fit, conjoined on tension. Axillary nerve not too tight and no gapping with sulcus and no gapping with external rotation.  We removed the trial components, we impacted with impaction grafting technique using bone graft from the patient's humeral head, impacted a hydroxyapatite coated size 12 stem with epi 1 right and we tightened that down with a 0-degree mark and then placed that again in about 5 degrees of retroversion, impacted that in position with a bone grafting, had nice tight fit, placed the +3 real poly, reduce the shoulder half with the soft tissue balancing.  Thorough irrigation of the shoulder, resected the remaining subscap.  We were not able to repair that, it was so degenerative, thin and torn.  We then went ahead and closed the shoulder after thorough irrigation, interrupted 0 Vicryl suture for the deltopectoral incision followed by 2- 0 Vicryl subcutaneous closure and 4-0 Monocryl for skin and portals. The patient tolerated the surgery well.     Almedia Balls. Ranell Patrick, M.D.     SRN/MEDQ  D:  02/19/2012  T:  02/20/2012  Job:  [id number]

## 2012-02-20 NOTE — Progress Notes (Signed)
Occupational Therapy Evaluation Patient Details Name: Kourtney Montesinos MRN: [id number] DOB: 01/04/1951 Today's Date: 02/20/2012 Time: 4098-1191 OT Time Calculation (min): 50 min  OT Assessment / Plan / Recommendation Clinical Impression  61 yo s/p Reverse TSA. Pt will benefit from skilled OT services to educate pt on written home shoulder protocol and increase pt/family independence with ADL due to below deficits. Will complete another session with pt when wife arrives. Pt will be ready for D/C after 2nd session. Wife will be able to assist pt as needed @ D/C. Pt will need to f/u with outpt services.    OT Assessment  Patient needs continued OT Services    Follow Up Recommendations  Outpatient OT    Barriers to Discharge None    Equipment Recommendations  None recommended by OT    Recommendations for Other Services    Frequency  Min 2X/week    Precautions / Restrictions Precautions Precautions: Shoulder Type of Shoulder Precautions: norris protocol Precaution Booklet Issued: Yes (comment) Required Braces or Orthoses: Other Brace/Splint (sling for comfort) Restrictions Weight Bearing Restrictions: Yes RUE Weight Bearing: Non weight bearing   Pertinent Vitals/Pain 6. Premedicated. Ice at end of session.    ADL  Eating/Feeding: Performed;Independent Where Assessed - Eating/Feeding: Chair Grooming: Simulated;Minimal assistance Where Assessed - Grooming: Unsupported standing Upper Body Bathing: Performed;Minimal assistance Where Assessed - Upper Body Bathing: Unsupported sit to stand Lower Body Bathing: Simulated;Minimal assistance Where Assessed - Lower Body Bathing: Unsupported sit to stand Upper Body Dressing: Simulated;Maximal assistance Where Assessed - Upper Body Dressing: Unsupported sitting Lower Body Dressing: Simulated;Minimal assistance Where Assessed - Lower Body Dressing: Unsupported sit to stand Toilet Transfer: Simulated;Supervision/safety Toilet Transfer Method:  Sit to Barista: Comfort height toilet Equipment Used: Other (comment) (sling) Transfers/Ambulation Related to ADLs: S ADL Comments: Pt without knowledge of shoulder precautions and compensatory techniques. shoulder protocol written information given and reviewed. Ice to R shoulder    OT Diagnosis: Generalized weakness;Acute pain  OT Problem List: Decreased strength;Decreased range of motion;Decreased coordination;Decreased safety awareness;Decreased knowledge of precautions;Impaired sensation;Obesity;Pain;Impaired UE functional use;Increased edema OT Treatment Interventions: Self-care/ADL training;Therapeutic exercise;Therapeutic activities;Patient/family education   OT Goals Acute Rehab OT Goals OT Goal Formulation: With patient Time For Goal Achievement: 02/27/12 Potential to Achieve Goals: Good ADL Goals Pt Will Perform Upper Body Bathing: with caregiver independent in assisting;Sitting, chair;with min assist;Unsupported ADL Goal: Upper Body Bathing - Progress: Goal set today Pt Will Perform Upper Body Dressing: with min assist;with caregiver independent in assisting;Sitting, bed;Unsupported ADL Goal: Upper Body Dressing - Progress: Goal set today Additional ADL Goal #1: Pt/wife will be independent in donning/doffing sling and sling wearing time/purpose (for comfort). ADL Goal: Additional Goal #1 - Progress: Goal set today Arm Goals Additional Arm Goal #1: Pt/wife will be independent with written HEP, including pendulums,  and AAROM for FF and ER. Arm Goal: Additional Goal #1 - Progress: Goal set today Additional Arm Goal #2: Pt/wife will verbally express importance of NWB status for RUE. Arm Goal: Additional Goal #2 - Progress: Goal set today  Visit Information  Last OT Received On: 02/20/12    Subjective Data      Prior Functioning     Home Living Lives With: Spouse Available Help at Discharge: Available 24 hours/day Type of Home: Apartment Home  Access: Stairs to enter [employer] of Steps: 5 Entrance Stairs-Rails: None Home Layout: One level Bathroom Shower/Tub: Forensic scientist: Standard Bathroom Accessibility: Yes How Accessible: Accessible via walker Home Adaptive Equipment: Dan Humphreys -  rolling Prior Function Level of Independence: Independent Able to Take Stairs?: Yes Driving: Yes Vocation: Retired Musician: No difficulties Dominant Hand: Right         Vision/Perception     Cognition  Overall Cognitive Status: Appears within functional limits for tasks assessed/performed Arousal/Alertness: Awake/alert Orientation Level: Appears intact for tasks assessed Behavior During Session: [facility] for tasks performed    Extremity/Trunk Assessment Right Upper Extremity Assessment RUE ROM/Strength/Tone: Deficits;Unable to fully assess;Due to pain;Due to precautions RUE ROM/Strength/Tone Deficits: elbow AAROM WFL. pendulums performed RUE Sensation:  (numbness from block) Left Upper Extremity Assessment LUE ROM/Strength/Tone: Within functional levels LUE Sensation: WFL - Light Touch;WFL - Proprioception Right Lower Extremity Assessment RLE ROM/Strength/Tone: Deficits RLE ROM/Strength/Tone Deficits: weakness, pain and parasthesia RLE Sensation: Deficits RLE Sensation Deficits: parasthesia Left Lower Extremity Assessment LLE ROM/Strength/Tone: Deficits LLE ROM/Strength/Tone Deficits: occasional weakness due to over compensation LLE Sensation: WFL - Light Touch;WFL - Proprioception LLE Coordination: WFL - gross/fine motor Trunk Assessment Trunk Assessment: Other exceptions (s/p back fusion 10 yrs ago)     Mobility Bed Mobility Bed Mobility: Supine to Sit Supine to Sit: 4: Min assist;HOB flat;With rails Transfers Transfers: Sit to Stand;Stand to Sit Sit to Stand: 5: Supervision;From bed Stand to Sit: 5: Supervision;To chair/3-in-1 Details for Transfer Assistance: vc  for NWB RUE     Shoulder Instructions Correct positioning of sling/immobilizer: Supervision/safety Pendulum exercises (written home exercise program): Supervision/safety ROM for elbow, wrist and digits of operated UE: Minimal assistance Sling wearing schedule (on at all times/off for ADL's): Supervision/safety Proper positioning of operated UE when showering: Supervision/safety Positioning of UE while sleeping: Supervision/safety   Exercise Shoulder Exercises Pendulum Exercise: AAROM;10 reps;Standing Elbow Flexion: AAROM;Right;10 reps Elbow Extension: AAROM;Right;10 reps   Balance Balance Balance Assessed:  (WFL)   End of Session OT - End of Session Activity Tolerance: Patient tolerated treatment well Patient left: in chair;with call bell/phone within reach Nurse Communication: Other (comment) (D/C planning)  GO     Tyree Vandruff,HILLARY 02/20/2012, 12:14 PM [facility], OTR/L  [phone] 02/20/2012

## 2012-02-24 ENCOUNTER — Encounter (HOSPITAL_COMMUNITY): Payer: Self-pay | Admitting: Orthopedic Surgery

## 2012-03-08 NOTE — Discharge Summary (Signed)
Physician Discharge Summary  Patient ID: Richard Johnson MRN: [id number] DOB/AGE: [birth year] 61 y.o.  Admit date: 02/19/2012 Discharge date: 03/08/2012  Admission Diagnoses:  Right shoulder end stage osteoarthritis  Discharge Diagnoses:  Same   Surgeries: Procedure(s):right REVERSE SHOULDER ARTHROPLASTY on 02/19/2012   Consultants: PT/OT  Discharged Condition: Stable  Hospital Course: Richard Johnson is an 61 y.o. male who was admitted 02/19/2012 with a chief complaint of No chief complaint on file. , and found to have a diagnosis of <principal problem not specified>.  They were brought to the operating room on 02/19/2012 and underwent the above named procedures.    The patient had an uncomplicated hospital course and was stable for discharge.  Recent vital signs:  Filed Vitals:   02/20/12 0531  BP: 109/69  Pulse: 98  Temp: 98.6 F (37 C)  Resp: 18    Recent laboratory studies:  Results for orders placed during the hospital encounter of 02/19/12  HEMOGLOBIN AND HEMATOCRIT, BLOOD      Component Value Range   Hemoglobin 13.1  13.0 - 17.0 g/dL   HCT 91.4  78.2 - 95.6 %  BASIC METABOLIC PANEL      Component Value Range   Sodium 134 (*) 135 - 145 mEq/L   Potassium 4.1  3.5 - 5.1 mEq/L   Chloride 100  96 - 112 mEq/L   CO2 25  19 - 32 mEq/L   Glucose, Bld 142 (*) 70 - 99 mg/dL   BUN 17  6 - 23 mg/dL   Creatinine, Ser 2.13  0.50 - 1.35 mg/dL   Calcium 8.5  8.4 - 08.6 mg/dL   GFR calc non Af Amer >90  >90 mL/min   GFR calc Af Amer >90  >90 mL/min    Discharge Medications:     Medication List     As of 03/08/2012 10:58 AM    STOP taking these medications         ibuprofen 200 MG tablet   Commonly known as: ADVIL,MOTRIN      TAKE these medications         clonazePAM 0.5 MG tablet   Commonly known as: KLONOPIN   Take 0.5 mg by mouth 2 (two) times daily as needed.      escitalopram 20 MG tablet   Commonly known as: LEXAPRO   Take 20 mg by mouth daily.     methocarbamol 500 MG tablet   Commonly known as: ROBAXIN   Take 500 mg by mouth 4 (four) times daily. For spasms      methocarbamol 500 MG tablet   Commonly known as: ROBAXIN   Take 1 tablet (500 mg total) by mouth 3 (three) times daily as needed.      metoprolol succinate 25 MG 24 hr tablet   Commonly known as: TOPROL-XL   Take 25 mg by mouth daily.      oxyCODONE 5 MG immediate release tablet   Commonly known as: Oxy IR/ROXICODONE   Take 5 mg by mouth 2 (two) times daily as needed. For pain      oxyCODONE-acetaminophen 5-325 MG per tablet   Commonly known as: PERCOCET/ROXICET   Take 1-2 tablets by mouth every 4 (four) hours as needed for pain.      phenytoin 100 MG ER capsule   Commonly known as: DILANTIN   Take 500 mg by mouth daily.      polyethylene glycol packet   Commonly known as: MIRALAX / GLYCOLAX   Take 17 g  by mouth daily. Take 1 packet daily until bowels become regular      simvastatin 20 MG tablet   Commonly known as: ZOCOR   Take 20 mg by mouth every evening.      sulfaSALAzine 500 MG tablet   Commonly known as: AZULFIDINE   Take 500 mg by mouth 2 (two) times daily. Stopped 02/13/12 for surgery      vitamin C 500 MG tablet   Commonly known as: ASCORBIC ACID   Take 500 mg by mouth daily.        Diagnostic Studies: Dg Shoulder Right  02/19/2012  *RADIOLOGY REPORT*  Clinical Data: 61 year old male status post shoulder arthroplasty.  RIGHT SHOULDER - 2+ VIEW  Comparison: 11/16/2011.  Findings: Portable supine AP view 1141 hours.  Acetabular and femoral implant in place.  Alignment appears within normal limits. No unexpected osseous changes identified.  Postoperative changes to the surrounding soft tissues including subcutaneous gas.  IMPRESSION: Right shoulder arthroplasty with no adverse features identified.   Original Report Authenticated By: Harley Hallmark, M.D.     Disposition: 01-Home or Self Care      Discharge Orders    Future Orders Please  Complete By Expires   Diet - low sodium heart healthy      Call MD / Call 911      Comments:   If you experience chest pain or shortness of breath, CALL 911 and be transported to the hospital emergency room.  If you develope a fever above 101 F, pus (white drainage) or increased drainage or redness at the wound, or calf pain, call your surgeon's office.   Constipation Prevention      Comments:   Drink plenty of fluids.  Prune juice may be helpful.  You may use a stool softener, such as Colace (over the counter) 100 mg twice a day.  Use MiraLax (over the counter) for constipation as needed.   Increase activity slowly as tolerated         Follow-up Information    Follow up with NORRIS,STEVEN R, MD. Call in 2 weeks. [phone])    Contact information:   [facility] [address], [address] [place] [address] [phone]           Signed: Thea Gist 03/08/2012, 10:58 AM

## 2012-08-23 ENCOUNTER — Other Ambulatory Visit: Payer: Self-pay

## 2012-08-23 MED ORDER — CLONAZEPAM 0.5 MG PO TABS: 0.5000 mg | ORAL_TABLET | Freq: Two times a day (BID) | ORAL | Status: DC | PRN

## 2012-11-14 ENCOUNTER — Telehealth: Payer: Self-pay | Admitting: Neurology

## 2012-11-14 MED ORDER — ESCITALOPRAM OXALATE 20 MG PO TABS: 20.0000 mg | ORAL_TABLET | Freq: Every day | ORAL | Status: DC

## 2012-11-14 NOTE — Telephone Encounter (Signed)
Rx sent 

## 2012-11-26 IMAGING — CR DG SHOULDER 2+V*R*
1 series · 1 of 1 positions shown · non-contrast
Comparison: 11/16/2011.

CLINICAL DATA: 61-year-old male status post shoulder arthroplasty.

RIGHT SHOULDER - 2+ VIEW

[AP]
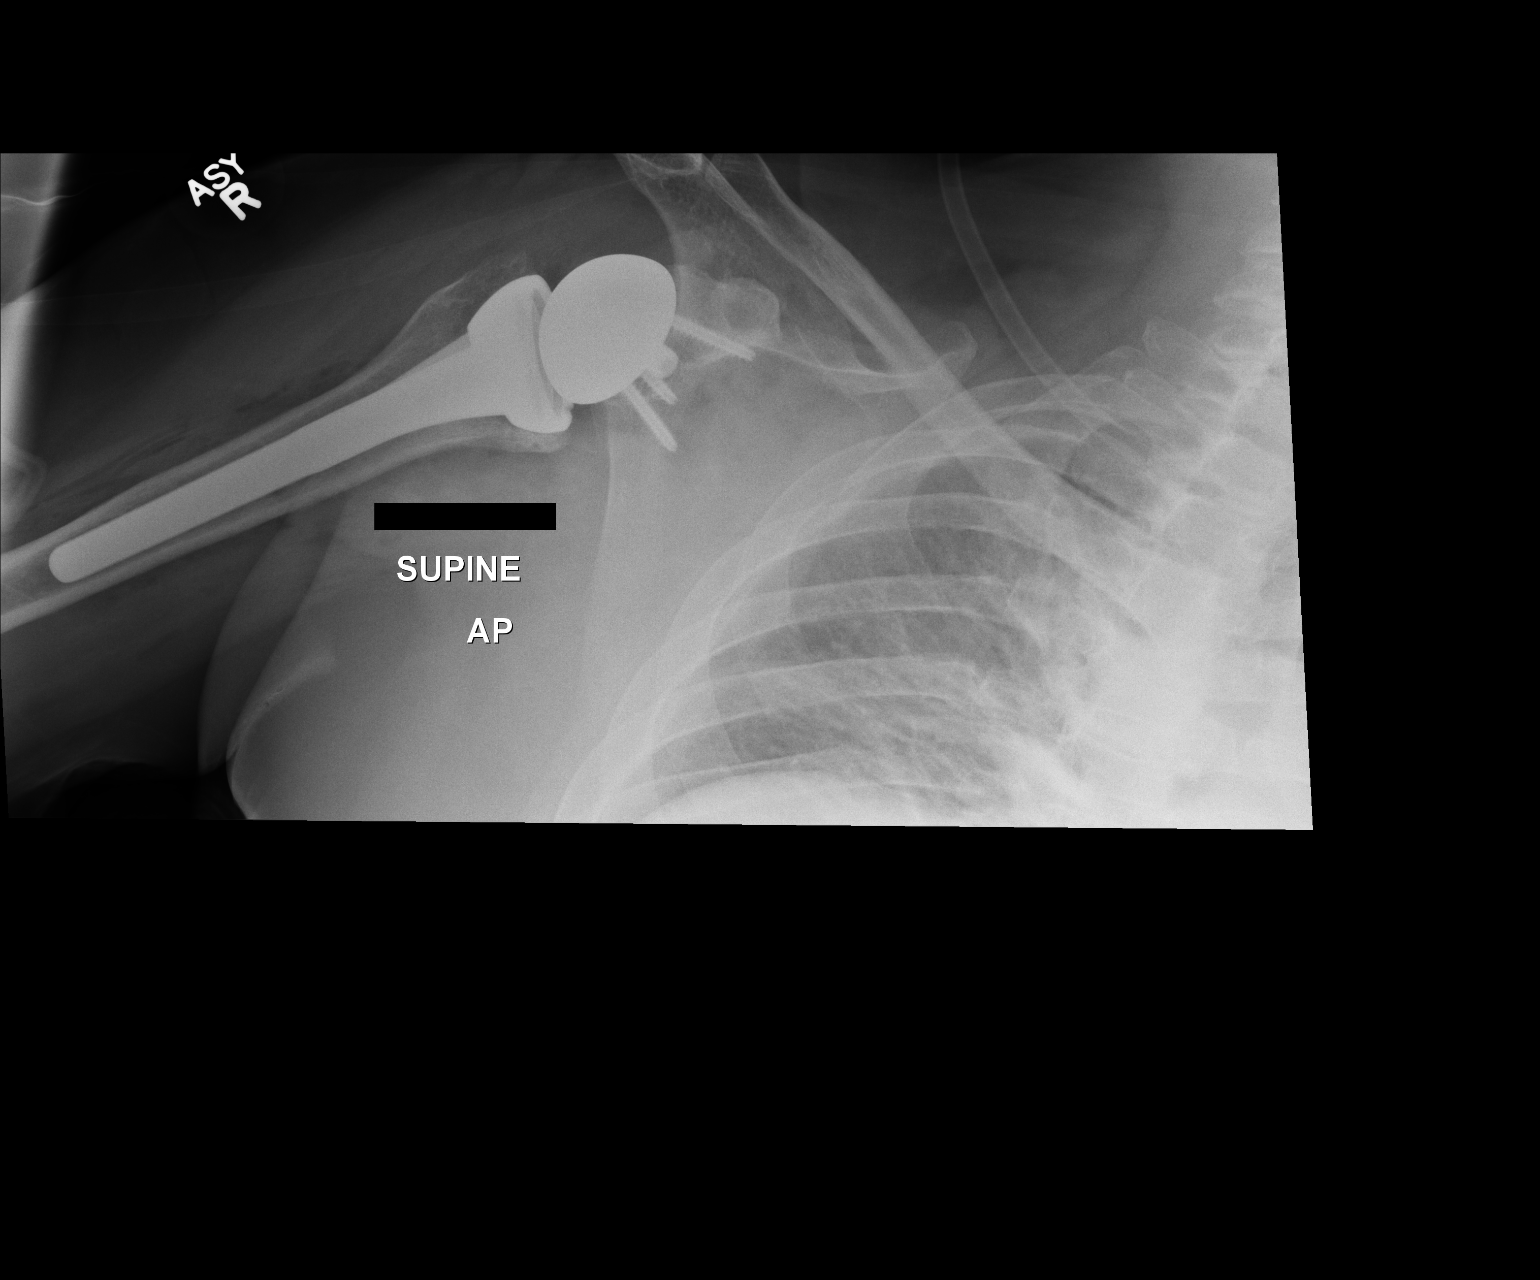

[1 of 1 positions shown; findings below may reference images not displayed]

FINDINGS: Portable supine AP view 1121 hours.  Acetabular and
femoral implant in place.  Alignment appears within normal limits.
No unexpected osseous changes identified.  Postoperative changes to
the surrounding soft tissues including subcutaneous gas.
IMPRESSION: Right shoulder arthroplasty with no adverse features identified.

## 2013-05-31 ENCOUNTER — Ambulatory Visit (INDEPENDENT_AMBULATORY_CARE_PROVIDER_SITE_OTHER): Payer: 59 | Admitting: Neurology

## 2013-05-31 ENCOUNTER — Encounter: Payer: Self-pay | Admitting: Neurology

## 2013-05-31 ENCOUNTER — Encounter (INDEPENDENT_AMBULATORY_CARE_PROVIDER_SITE_OTHER): Payer: Self-pay

## 2013-05-31 VITALS — BP 140/80 | HR 84 | Wt 265.0 lb

## 2013-05-31 DIAGNOSIS — G40309 Generalized idiopathic epilepsy and epileptic syndromes, not intractable, without status epilepticus: Secondary | ICD-10-CM

## 2013-05-31 HISTORY — DX: Generalized idiopathic epilepsy and epileptic syndromes, not intractable, without status epilepticus: G40.309

## 2013-05-31 MED ORDER — PHENYTOIN SODIUM EXTENDED 100 MG PO CAPS: ORAL_CAPSULE | ORAL | Status: DC

## 2013-05-31 MED ORDER — CLONAZEPAM 0.5 MG PO TABS: 0.5000 mg | ORAL_TABLET | Freq: Two times a day (BID) | ORAL | Status: DC | PRN

## 2013-05-31 NOTE — Progress Notes (Signed)
Reason for visit: Seizures  Richard Johnson is an 63 y.o. male  History of present illness:  Richard Johnson is a 63 year old right-handed white male with a history of seizures who has done well on Dilantin therapy. The patient has had a lot of stress recently as one of his daughters has passed away with multisystem failure, another daughter had TTP. The patient has not had any recurrent seizures, but his anxiety level has heightened with illnesses of his daughters. 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 The patient is tolerating the Dilantin well. The patient returns for an evaluation.  Past Medical History  Diagnosis Date  . Seizure   . Inflammatory arthritis     patient still going through diagnosis  . Premature atrial contractions     does not see a cardiologist  . Anxiety   . Obesity   . Hypertension   . 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 Lumbar fusion  2001    Lumber 3-4, 4-5 with rods  . Appendectomy    . Cholecystectomy  1989    open  . Knee arthroscopy  2001    left  . Tonsillectomy    . Adenoidectomy      as child w/tonsillectomy.  . Reverse shoulder arthroplasty  02/19/2012    Procedure: REVERSE SHOULDER ARTHROPLASTY;  Surgeon: Verlee Rossetti, MD;  Location: [facility] OR;  Service: Orthopedics;  Laterality: Right;  right total reverse arthroplasty  . Hip arthroplasty Bilateral     Family History  Problem Relation Age of Onset  . Stroke Mother   . Parkinson's disease Father   . Lung cancer Brother     Social history:  reports that he has never smoked. He has never used smokeless tobacco. He reports that he does not drink alcohol or use illicit drugs.    Allergies  Allergen Reactions  . Contrast Media [Iodinated Diagnostic Agents] Anaphylaxis  . Dilaudid [Hydromorphone Hcl]     Severe anxiety  . Mysoline [Primidone]     Causes skin to peel  off  . Penicillins Other (See Comments)    childhoold   . Phenobarbital     Causes skin to peel off    Medications:  Current Outpatient Prescriptions on File Prior to Visit  Medication Sig Dispense Refill  . escitalopram (LEXAPRO) 20 MG tablet Take 1 tablet (20 mg total) by mouth daily.  90 tablet  1  . methocarbamol (ROBAXIN) 500 MG tablet Take 500 mg by mouth 4 (four) times daily. For spasms      . metoprolol succinate (TOPROL-XL) 25 MG 24 hr tablet Take 25 mg by mouth daily.      . simvastatin (ZOCOR) 20 MG tablet Take 20 mg by mouth every evening.       No current facility-administered medications on file prior to visit.    ROS:  Out of a complete 14 system review of symptoms, the patient complains only of the following symptoms, and all other reviewed systems are negative.  Activity change Cold intolerance Insomnia Joint pain, back pain, muscle cramps Memory loss, seizures Nervousness  Blood pressure 140/80, pulse 84, weight 265 lb (120.203 kg).  Physical Exam  General: The patient is alert and cooperative at the time of the examination. The patient is moderately obese.  Skin: No significant peripheral edema is noted.   Neurologic Exam  Mental status: The patient is  oriented x 3. Mini-Mental status examination done today shows a total score of 28/30.  Cranial nerves: Facial symmetry is present. Speech is normal, no aphasia or dysarthria is noted. Extraocular movements are full. Visual fields are full.  Motor: The patient has good strength in all 4 extremities.  Sensory examination: Symmetric sensation to soft touch is noted on the face, arms, and legs  Coordination: The patient has good finger-nose-finger and heel-to-shin bilaterally.  Gait and station: The patient has a normal gait. Tandem gait is minimally unsteady. Romberg is negative. No drift is seen.  Reflexes: Deep tendon reflexes are symmetric.   Assessment/Plan:  One. History seizures  The  patient has been well controlled with seizures. The patient indicates that a Dilantin level was done within the last 2 months, with a level of 10. The patient has been taking 200 mg of Dilantin in the morning, 300 mg in the evening. The patient will followup in one year, a prescription was written for the Dilantin and for clonazepam.  Richard Johnson. Keith Peggy Monk MD 05/31/2013 7:32 PM  [facility] [address] [place] [id number]  Phone [phone] Fax [phone]

## 2013-05-31 NOTE — Patient Instructions (Signed)
Epilepsy Epilepsy is a disorder in which a person has repeated seizures over time. A seizure is a release of abnormal electrical activity in the brain. Seizures can cause a change in attention, behavior, or the ability to remain awake and alert (altered mental status). Seizures often involve uncontrollable shaking (convulsions).  Most people with epilepsy lead normal lives. However, people with epilepsy are at an increased risk of falls, accidents, and injuries. Therefore, it is important to begin treatment right away. CAUSES  Epilepsy has many possible causes. Anything that disturbs the normal pattern of brain cell activity can lead to seizures. This may include:   Head injury.  Birth trauma.  High fever as a child.  Stroke.  Bleeding into or around the brain.  Certain drugs.  Prolonged low oxygen, such as what occurs after CPR efforts.  Abnormal brain development.  Certain illnesses, such as meningitis, encephalitis (brain infection), malaria, and other infections.  An imbalance of nerve signaling chemicals (neurotransmitters).  SIGNS AND SYMPTOMS  The symptoms of a seizure can vary greatly from one person to another. Right before a seizure, you may have a warning (aura) that a seizure is about to occur. An aura may include the following symptoms:  Fear or anxiety.  Nausea.  Feeling like the room is spinning (vertigo).  Vision changes, such as seeing flashing lights or spots. Common symptoms during a seizure include:  Abnormal sensations, such as an abnormal smell or a bitter taste in the mouth.   Sudden, general body stiffness.   Convulsions that involve rhythmic jerking of the face, arm, or leg on one or both sides.   Sudden change in consciousness.   Appearing to be awake but not responding.   Appearing to be asleep but cannot be awakened.   Grimacing, chewing, lip smacking, drooling, tongue biting, or loss of bowel or bladder control. After a seizure,  you may feel sleepy for a while. DIAGNOSIS  Your health care provider will ask about your symptoms and take a medical history. Descriptions from any witnesses to your seizures will be very helpful in the diagnosis. A physical exam, including a detailed neurological exam, is necessary. Various tests may be done, such as:   An electroencephalogram (EEG). This is a painless test of your brain waves. In this test, a diagram is created of your brain waves. These diagrams can be interpreted by a specialist.  An MRI of the brain.   A CT scan of the brain.   A spinal tap (lumbar puncture, LP).  Blood tests to check for signs of infection or abnormal blood chemistry. TREATMENT  There is no cure for epilepsy, but it is generally treatable. Once epilepsy is diagnosed, it is important to begin treatment as soon as possible. For most people with epilepsy, seizures can be controlled with medicines. The following may also be used:  A pacemaker for the brain (vagus nerve stimulator) can be used for people with seizures that are not well controlled by medicine.  Surgery on the brain. For some people, epilepsy eventually goes away. HOME CARE INSTRUCTIONS   Follow your health care provider's recommendations on driving and safety in normal activities.  Get enough rest. Lack of sleep can cause seizures.  Only take over-the-counter or prescription medicines as directed by your health care provider. Take any prescribed medicine exactly as directed.  Avoid any known triggers of your seizures.  Keep a seizure diary. Record what you recall about any seizure, especially any possible trigger.   Make   sure the people you live and work with know that you are prone to seizures. They should receive instructions on how to help you. In general, a witness to a seizure should:   Cushion your head and body.   Turn you on your side.   Avoid unnecessarily restraining you.   Not place anything inside your  mouth.   Call for emergency medical help if there is any question about what has occurred.   Follow up with your health care provider as directed. You may need regular blood tests to monitor the levels of your medicine.  SEEK MEDICAL CARE IF:   You develop signs of infection or other illness. This might increase the risk of a seizure.   You seem to be having more frequent seizures.   Your seizure pattern is changing.  SEEK IMMEDIATE MEDICAL CARE IF:   You have a seizure that does not stop after a few moments.   You have a seizure that causes any difficulty in breathing.   You have a seizure that results in a very severe headache.   You have a seizure that leaves you with the inability to speak or use a part of your body.  Document Released: 05/04/2005 Document Revised: 02/22/2013 Document Reviewed: 12/14/2012 Exit[employer] Patient Information 2014 [employer].  

## 2013-06-19 ENCOUNTER — Telehealth: Payer: Self-pay | Admitting: Neurology

## 2013-06-19 ENCOUNTER — Telehealth: Payer: Self-pay | Admitting: *Deleted

## 2013-06-19 NOTE — Telephone Encounter (Signed)
Patient calling to state that he was just discharged from the hospital in [place] due to multiple seizures and wanted to make Dr. Anne HahnWillis aware. Patient also states he has 10.5 Dilantin level.

## 2013-06-19 NOTE — Telephone Encounter (Signed)
Patient needs hosp f/u seizures. Saw CM on 05/31/13. May see np or Willis.

## 2013-06-19 NOTE — Telephone Encounter (Signed)
Patient calling back to return Dr. Anne HahnWillis' call, left a better number where he can be reached which is [phone]. Please call patient back.

## 2013-06-19 NOTE — Telephone Encounter (Signed)
I called the patient. The patient has had several more seizures. He has been under a lot of stress with illnesses in his family. The Dilantin level was at his usual baseline, around 10. I'll try to get a revisit set up sometime in the next couple weeks. We may need to add another 50 mg onto what he is currently taking.

## 2013-06-20 MED ORDER — PHENYTOIN 50 MG PO CHEW: 50.0000 mg | CHEWABLE_TABLET | Freq: Every day | ORAL | Status: DC

## 2013-06-20 NOTE — Telephone Encounter (Signed)
Patient would like to know if dilantin needs to be increased. He does have f/u appt on next Thursday. He says when he was d/c from hospital his level went from 10 to 14 upon discharge. He had a sinus infection so he is not sure if this had anything to do with it.

## 2013-06-20 NOTE — Telephone Encounter (Signed)
I called the patient. The patient had several seizures recently. Dilantin level was 10.5. I will go up on the Dilantin by 50 mg daily. Prescription was called in for the 50 mg tablets. The patient indicates that he was not on antibiotics at the time of the seizures.

## 2013-06-29 ENCOUNTER — Ambulatory Visit (INDEPENDENT_AMBULATORY_CARE_PROVIDER_SITE_OTHER): Payer: 59 | Admitting: Neurology

## 2013-06-29 ENCOUNTER — Encounter: Payer: Self-pay | Admitting: Neurology

## 2013-06-29 VITALS — BP 120/70 | HR 70 | Wt 259.0 lb

## 2013-06-29 DIAGNOSIS — G40309 Generalized idiopathic epilepsy and epileptic syndromes, not intractable, without status epilepticus: Secondary | ICD-10-CM

## 2013-06-29 DIAGNOSIS — Z5181 Encounter for therapeutic drug level monitoring: Secondary | ICD-10-CM

## 2013-06-29 NOTE — Progress Notes (Signed)
Reason for visit: Seizures  Tomasita MorrowJoseph Majchrzak is an 63 y.o. male  History of present illness:  [name] is a 63 year old right-handed white male with a history of generalized epilepsy. The patient recently had seizures within the last several weeks associated with post ictal confusion lasting about 24 hours. The patient had a Dilantin level of around 10.5 when he was admitted to the hospital. The patient has had an increase on the Dilantin taking 250 mg in the morning and 300 mg in the evening. The patient is having some mild gait instability problems and some cognitive clouding. The patient has had soreness of the muscles and joints following the seizure. The patient has been placed on Nucynta. The patient returns to this office for further evaluation. The patient is not operating motor vehicle at this time.  Past Medical History  Diagnosis Date  . Seizure   . Inflammatory arthritis     patient still going through diagnosis  . Premature atrial contractions     does not see a cardiologist  . Anxiety   . Obesity   . Hypertension   . 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 Lumbar fusion  2001    Lumber 3-4, 4-5 with rods  . Appendectomy    . Cholecystectomy  1989    open  . Knee arthroscopy  2001    left  . Tonsillectomy    . Adenoidectomy      as child w/tonsillectomy.  . Reverse shoulder arthroplasty  02/19/2012    Procedure: REVERSE SHOULDER ARTHROPLASTY;  Surgeon: Verlee RossettiSteven R Norris, MD;  [facility];  Service: Orthopedics;  Laterality: Right;  right total reverse arthroplasty  . Hip arthroplasty Bilateral     Family History  Problem Relation Age of Onset  . Stroke Mother   . Parkinson's disease Father   . Lung cancer Brother     Social history:  reports that he has never smoked. He has never used smokeless tobacco. He reports that he does not drink alcohol or use illicit  drugs.    Allergies  Allergen Reactions  . Contrast Media [Iodinated Diagnostic Agents] Anaphylaxis  . Dilaudid [Hydromorphone Hcl]     Severe anxiety  . Mysoline [Primidone]     Causes skin to peel off  . Penicillins Other (See Comments)    childhoold   . Phenobarbital     Causes skin to peel off    Medications:  Current Outpatient Prescriptions on File Prior to Visit  Medication Sig Dispense Refill  . clonazePAM (KLONOPIN) 0.5 MG tablet Take 1 tablet (0.5 mg total) by mouth 2 (two) times daily as needed.  60 tablet  5  . escitalopram (LEXAPRO) 20 MG tablet Take 1 tablet (20 mg total) by mouth daily.  90 tablet  1  . methocarbamol (ROBAXIN) 500 MG tablet Take 500 mg by mouth 4 (four) times daily. For spasms      . metoprolol succinate (TOPROL-XL) 25 MG 24 hr tablet Take 25 mg by mouth daily.      . phenytoin (DILANTIN) 100 MG ER capsule Two capsules in the morning and three capsules in the evening  150 capsule  11  . phenytoin (DILANTIN) 50 MG tablet Chew 1 tablet (50 mg total) by mouth daily.  30 tablet  5  . simvastatin (ZOCOR) 20 MG tablet Take 20 mg by mouth every evening.      .Marland Kitchen  tapentadol (NUCYNTA) 50 MG TABS tablet Take 50 mg by mouth every 6 (six) hours as needed for moderate pain.       No current facility-administered medications on file prior to visit.    ROS:  Out of a complete 14 system review of symptoms, the patient complains only of the following symptoms, and all other reviewed systems are negative.  Fatigue Cough Insomnia Joint pain, back pain, achy muscles, muscle cramps Memory loss, seizure, tremors Anxiety  Blood pressure 120/70, pulse 70, weight 259 lb (117.482 kg).  Physical Exam  General: The patient is alert and cooperative at the time of the examination. The patient is moderately obese.  Neuromuscular: The patient has restriction of range of movement with abduction of the arms bilaterally, and with flexion at the hips bilaterally.  Skin:  No significant peripheral edema is noted.   Neurologic Exam  Mental status: The patient is oriented x 3.  Cranial nerves: Facial symmetry is present. Speech is normal, no aphasia or dysarthria is noted. Extraocular movements are full. Visual fields are full.  Motor: The patient has good strength in all 4 extremities.  Sensory examination: Soft touch sensation is symmetric on the face, arms, and legs.  Coordination: The patient has good finger-nose-finger and heel-to-shin bilaterally.  Gait and station: The patient has a normal gait. Tandem gait was not tested. Romberg is negative. No drift is seen.  Reflexes: Deep tendon reflexes are symmetric.   Assessment/Plan:  One. History of seizure disorder with recent recurrence  The patient will be sent for blood work to check the Dilantin level on the increased dose of Dilantin. The patient may require a second medication such as Keppra in the future if he cannot be completely controlled on Dilantin alone. The patient will followup in 6 months. He has not operate a motor vehicle for 6 months following the seizure.  [name] MD 06/29/2013 7:32 PM  [facility] [address] [place], [place] [address]  Phone [phone] Fax [phone]

## 2013-06-29 NOTE — Patient Instructions (Signed)
Epilepsy Epilepsy is a disorder in which a person has repeated seizures over time. A seizure is a release of abnormal electrical activity in the brain. Seizures can cause a change in attention, behavior, or the ability to remain awake and alert (altered mental status). Seizures often involve uncontrollable shaking (convulsions).  Most people with epilepsy lead normal lives. However, people with epilepsy are at an increased risk of falls, accidents, and injuries. Therefore, it is important to begin treatment right away. CAUSES  Epilepsy has many possible causes. Anything that disturbs the normal pattern of brain cell activity can lead to seizures. This may include:   Head injury.  Birth trauma.  High fever as a child.  Stroke.  Bleeding into or around the brain.  Certain drugs.  Prolonged low oxygen, such as what occurs after CPR efforts.  Abnormal brain development.  Certain illnesses, such as meningitis, encephalitis (brain infection), malaria, and other infections.  An imbalance of nerve signaling chemicals (neurotransmitters).  SIGNS AND SYMPTOMS  The symptoms of a seizure can vary greatly from one person to another. Right before a seizure, you may have a warning (aura) that a seizure is about to occur. An aura may include the following symptoms:  Fear or anxiety.  Nausea.  Feeling like the room is spinning (vertigo).  Vision changes, such as seeing flashing lights or spots. Common symptoms during a seizure include:  Abnormal sensations, such as an abnormal smell or a bitter taste in the mouth.   Sudden, general body stiffness.   Convulsions that involve rhythmic jerking of the face, arm, or leg on one or both sides.   Sudden change in consciousness.   Appearing to be awake but not responding.   Appearing to be asleep but cannot be awakened.   Grimacing, chewing, lip smacking, drooling, tongue biting, or loss of bowel or bladder control. After a seizure,  you may feel sleepy for a while. DIAGNOSIS  Your health care provider will ask about your symptoms and take a medical history. Descriptions from any witnesses to your seizures will be very helpful in the diagnosis. A physical exam, including a detailed neurological exam, is necessary. Various tests may be done, such as:   An electroencephalogram (EEG). This is a painless test of your brain waves. In this test, a diagram is created of your brain waves. These diagrams can be interpreted by a specialist.  An MRI of the brain.   A CT scan of the brain.   A spinal tap (lumbar puncture, LP).  Blood tests to check for signs of infection or abnormal blood chemistry. TREATMENT  There is no cure for epilepsy, but it is generally treatable. Once epilepsy is diagnosed, it is important to begin treatment as soon as possible. For most people with epilepsy, seizures can be controlled with medicines. The following may also be used:  A pacemaker for the brain (vagus nerve stimulator) can be used for people with seizures that are not well controlled by medicine.  Surgery on the brain. For some people, epilepsy eventually goes away. HOME CARE INSTRUCTIONS   Follow your health care provider's recommendations on driving and safety in normal activities.  Get enough rest. Lack of sleep can cause seizures.  Only take over-the-counter or prescription medicines as directed by your health care provider. Take any prescribed medicine exactly as directed.  Avoid any known triggers of your seizures.  Keep a seizure diary. Record what you recall about any seizure, especially any possible trigger.   Make   sure the people you live and work with know that you are prone to seizures. They should receive instructions on how to help you. In general, a witness to a seizure should:   Cushion your head and body.   Turn you on your side.   Avoid unnecessarily restraining you.   Not place anything inside your  mouth.   Call for emergency medical help if there is any question about what has occurred.   Follow up with your health care provider as directed. You may need regular blood tests to monitor the levels of your medicine.  SEEK MEDICAL CARE IF:   You develop signs of infection or other illness. This might increase the risk of a seizure.   You seem to be having more frequent seizures.   Your seizure pattern is changing.  SEEK IMMEDIATE MEDICAL CARE IF:   You have a seizure that does not stop after a few moments.   You have a seizure that causes any difficulty in breathing.   You have a seizure that results in a very severe headache.   You have a seizure that leaves you with the inability to speak or use a part of your body.  Document Released: 05/04/2005 Document Revised: 02/22/2013 Document Reviewed: 12/14/2012 Exit[employer] Patient Information 2014 [employer].  

## 2013-07-03 ENCOUNTER — Telehealth: Payer: Self-pay | Admitting: Neurology

## 2013-07-03 NOTE — Telephone Encounter (Signed)
I called the patient. The Dilantin level is in a good range. Level is 16.2. He is to stay on his current dosing of Dilantin.

## 2013-07-31 ENCOUNTER — Telehealth: Payer: Self-pay | Admitting: Neurology

## 2013-07-31 MED ORDER — PHENYTOIN 50 MG PO CHEW: 25.0000 mg | CHEWABLE_TABLET | Freq: Every day | ORAL | Status: DC

## 2013-07-31 NOTE — Telephone Encounter (Signed)
Patient had Dilantin level check @ PCP and it was 26.4--please call patient--thank you.

## 2013-07-31 NOTE — Telephone Encounter (Signed)
I called patient. He is to drop the Dilantin dosing to 225 mg in the morning and 300 mg in the evening. We will need to check the Dilantin level in about 3 weeks. I'll try to call him at that time.

## 2013-09-12 ENCOUNTER — Telehealth: Payer: Self-pay | Admitting: Neurology

## 2013-09-12 NOTE — Telephone Encounter (Signed)
I called patient. The Dilantin level is 20.3. He indicates that he is feeling well, we will not alter the dose. The primary care doctor also did a comprehensive metabolic profile, lipid panel, hemoglobin A1c, amylase and lipase levels. Hemoglobin A1c was 5.8. Chemistry and liver panel was unremarkable.

## 2013-10-27 ENCOUNTER — Telehealth: Payer: Self-pay | Admitting: Neurology

## 2013-10-27 MED ORDER — PHENYTOIN SODIUM EXTENDED 100 MG PO CAPS: ORAL_CAPSULE | ORAL | Status: DC

## 2013-10-27 MED ORDER — PHENYTOIN 50 MG PO CHEW: 25.0000 mg | CHEWABLE_TABLET | Freq: Every day | ORAL | Status: DC

## 2013-10-27 NOTE — Telephone Encounter (Signed)
Spoke with patient and due to insurance purposes,  medication (Dilantin) is too expensive now , has to pay the total expense and is requesting generic(Phenytoin)

## 2013-10-27 NOTE — Telephone Encounter (Signed)
I will switch the patient to generic phenytoin.

## 2013-10-27 NOTE — Telephone Encounter (Signed)
Patient calling to state that he would like to discuss changing his anticonvulsant medication Dilantin to Phenytoin due to his insurance coverage changing dramatically. Please return call to patient and advise.

## 2013-10-30 ENCOUNTER — Telehealth: Payer: Self-pay | Admitting: Neurology

## 2013-10-30 NOTE — Telephone Encounter (Signed)
I called patient. I have no problem with the use of Topamax. If the medication is used, it should not be withdrawn suddenly.

## 2013-10-30 NOTE — Telephone Encounter (Signed)
Patient stated pain management wanted to know if they could add Topomax for pain.  Please call and advise

## 2013-10-30 NOTE — Telephone Encounter (Signed)
Left Vm to call back for more information concerning the Topomax

## 2013-10-30 NOTE — Telephone Encounter (Signed)
Spoke with patient, said that his pain management physician ([facility]), wanted to know if Dr Anne HahnWillis had any reservations about adding Topomax since patient has a seizure history

## 2013-12-29 ENCOUNTER — Encounter: Payer: Self-pay | Admitting: Adult Health

## 2013-12-29 ENCOUNTER — Ambulatory Visit (INDEPENDENT_AMBULATORY_CARE_PROVIDER_SITE_OTHER): Payer: 59 | Admitting: Adult Health

## 2013-12-29 VITALS — BP 104/50 | HR 83 | Ht 68.0 in | Wt 264.0 lb

## 2013-12-29 DIAGNOSIS — Z5181 Encounter for therapeutic drug level monitoring: Secondary | ICD-10-CM | POA: Insufficient documentation

## 2013-12-29 DIAGNOSIS — G40309 Generalized idiopathic epilepsy and epileptic syndromes, not intractable, without status epilepticus: Secondary | ICD-10-CM

## 2013-12-29 MED ORDER — PHENYTOIN 50 MG PO CHEW: 25.0000 mg | CHEWABLE_TABLET | Freq: Every day | ORAL | Status: DC

## 2013-12-29 MED ORDER — PHENYTOIN SODIUM EXTENDED 100 MG PO CAPS: ORAL_CAPSULE | ORAL | Status: DC

## 2013-12-29 NOTE — Patient Instructions (Signed)
Seizure, Adult A seizure means there is unusual activity in the brain. A seizure can cause changes in attention or behavior. Seizures often cause shaking (convulsions). Seizures often last from 30 seconds to 2 minutes. HOME CARE   If you are given medicines, take them exactly as told by your doctor.  Keep all doctor visits as told.  Do not swim or drive until your doctor says it is okay.  Teach others what to do if you have a seizure. They should:  Lay you on the ground.  Put a cushion under your head.  Loosen any tight clothing around your neck.  Turn you on your side.  Stay with you until you get better. GET HELP RIGHT AWAY IF:   The seizure lasts longer than 2 to 5 minutes.  The seizure is very bad.  The person does not wake up after the seizure.  The person's attention or behavior changes. Drive the person to the emergency room or call your local emergency services (911 in [place] MAKE SURE YOU:   Understand these instructions.  Will watch your condition.  Will get help right away if you are not doing well or get worse. Document Released: 10/21/2007 Document Revised: 07/27/2011 Document Reviewed: 04/22/2011 ExitCare Patient Information 2015 [employer]. This information is not intended to replace advice given to you by your health care provider. Make sure you discuss any questions you have with your health care provider.  

## 2013-12-29 NOTE — Progress Notes (Signed)
PATIENT: Richard Johnson DOB: [birth year]  REASON FOR VISIT: follow up HISTORY FROM: patient  HISTORY OF PRESENT ILLNESS: Richard Johnson is a 63 year old male with a history of generalized seizures. He returns today for followup. He is currently taken Dilantin and is tolerating it well. He states that he has not had any additional seizures since the last visit. His last event was in January. Patient is currently not driving and has not driven since his event in January. Patient states that he has noticed some mild ataxia with his gait. Denies any falls. He does not walk with an assistive device. He only has one new complaint and that is short term memory loss that started after his last seizure. He states that he noticed he was losing his medical terminology. He states that his wife has noticed that at times he gets stuck on words. Patient is also on oxycodone for back pain. He states that his medication could also be playing a role in his memory loss.  HISTORY 06/29/13 (CW):  Mr. Richard Johnson is a 63 year old right-handed white male with a history of generalized epilepsy. The patient recently had seizures within the last several weeks associated with post ictal confusion lasting about 24 hours. The patient had a Dilantin level of around 10.5 when he was admitted to the hospital. The patient has had an increase on the Dilantin taking 250 mg in the morning and 300 mg in the evening. The patient is having some mild gait instability problems and some cognitive clouding. The patient has had soreness of the muscles and joints following the seizure. The patient has been placed on Nucynta. The patient returns to this office for further evaluation. The patient is not operating motor vehicle at this time.   REVIEW OF SYSTEMS: Full 14 system review of systems performed and notable only for:  Constitutional: N/A  Eyes: N/A Ear/Nose/Throat: N/A  Skin: N/A  Cardiovascular: N/A  Respiratory: N/A  Gastrointestinal: N/A    Genitourinary: N/A Hematology/Lymphatic: N/A  Endocrine: N/A Musculoskeletal: Back pain, aching muscles, neck pain  Allergy/Immunology: N/A  Neurological: Memory loss, seizures and tremors Psychiatric: depression Sleep:  insomnia   ALLERGIES: Allergies  Allergen Reactions  . Contrast Media [Iodinated Diagnostic Agents] Anaphylaxis  . Dilaudid [Hydromorphone Hcl]     Severe anxiety  . Mysoline [Primidone]     Causes skin to peel off  . Penicillins Other (See Comments)    childhoold   . Phenobarbital     Causes skin to peel off    HOME MEDICATIONS: Outpatient Prescriptions Prior to Visit  Medication Sig Dispense Refill  . escitalopram (LEXAPRO) 20 MG tablet Take 1 tablet (20 mg total) by mouth daily.  90 tablet  1  . methocarbamol (ROBAXIN) 500 MG tablet Take 500 mg by mouth every 6 (six) hours as needed. For spasms      . metoprolol succinate (TOPROL-XL) 25 MG 24 hr tablet Take 25 mg by mouth daily.      . phenytoin (DILANTIN) 100 MG ER capsule Two capsules in the morning and three capsules in the evening  150 capsule  11  . phenytoin (DILANTIN) 50 MG tablet Chew 0.5 tablets (25 mg total) by mouth daily.  15 tablet  5  . albuterol (PROVENTIL) (2.5 MG/3ML) 0.083% nebulizer solution Take 0.083 mg by nebulization as needed.      . clonazePAM (KLONOPIN) 0.5 MG tablet Take 1 tablet (0.5 mg total) by mouth 2 (two) times daily as needed.  60  tablet  5  . fluticasone (FLONASE) 50 MCG/ACT nasal spray Place 2 sprays into both nostrils daily.      . NUCYNTA 75 MG TABS Take 75 mg by mouth daily.      . simvastatin (ZOCOR) 20 MG tablet Take 20 mg by mouth every evening.      . tapentadol (NUCYNTA) 50 MG TABS tablet Take 50 mg by mouth every 6 (six) hours as needed for moderate pain.       No facility-administered medications prior to visit.    PAST MEDICAL HISTORY: Past Medical History  Diagnosis Date  . Seizure   . Inflammatory arthritis     patient still going through diagnosis   . Premature atrial contractions     does not see a cardiologist  . Anxiety   . Obesity   . Hypertension   . 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 Lumbar fusion  2001    Lumber 3-4, 4-5 with rods  . Appendectomy    . Cholecystectomy  1989    open  . Knee arthroscopy  2001    left  . Tonsillectomy    . Adenoidectomy      as child w/tonsillectomy.  . Reverse shoulder arthroplasty  02/19/2012    Procedure: REVERSE SHOULDER ARTHROPLASTY;  Surgeon: Verlee Rossetti, MD;  Location: [facility] OR;  Service: Orthopedics;  Laterality: Right;  right total reverse arthroplasty  . Hip arthroplasty Bilateral     FAMILY HISTORY: Family History  Problem Relation Age of Onset  . Stroke Mother   . Parkinson's disease Father   . Lung cancer Brother     SOCIAL HISTORY: History   Social History  . Marital Status: Married    Spouse Name: N/A    Number of Children: 2  . Years of Education: college   Occupational History  . nurse anesthesist    Social History Main Topics  . Smoking status: Never Smoker   . Smokeless tobacco: Never Used  . Alcohol Use: No  . Drug Use: No  . Sexual Activity: Not on file   Other Topics Concern  . Not on file   Social History Narrative   Patient is married Richard Johnson) and lives with his wife.     Patient has two children.   Patient is a Education officer, museum.   Patient has a Music therapist.   Patient is right-handed.            PHYSICAL EXAM  Filed Vitals:   12/29/13 1349  BP: 104/50  Pulse: 83  Height: 5\' 8"  (1.727 m)  Weight: 264 lb (119.75 kg)   Body mass index is 40.15 kg/(m^2).  Generalized: Well developed, in no acute distress   Neurological examination  Mentation: Alert oriented to time, place, history taking. Follows all commands speech and language fluent. MOCA 23/30 Cranial nerve II-XII: Pupils were equal round  reactive to light. Extraocular movements were full, visual field were full on confrontational test. Facial sensation and strength were normal. Uvula tongue midline. Head turning and shoulder shrug  were normal and symmetric. Motor: The motor testing reveals 5 over 5 strength of all 4 extremities. Good symmetric motor tone is noted throughout.  Sensory: Sensory testing is intact to soft touch on all 4 extremities. No evidence of extinction is noted.  Coordination: Cerebellar testing reveals good finger-nose-finger and heel-to-shin bilaterally.  Gait and station: Gait is normal.  Tandem gait is normal. Romberg is negative. No drift is seen.  Reflexes: Deep tendon reflexes are symmetric and normal bilaterally.     DIAGNOSTIC DATA (LABS, IMAGING, TESTING) - I reviewed patient records, labs, notes, testing and imaging myself where available.  Lab Results  Component Value Date   WBC 9.2 02/17/2012   HGB 13.1 02/20/2012   HCT 40.1 02/20/2012   MCV 95.7 02/17/2012   PLT 334 02/17/2012      Component Value Date/Time   NA 134* 02/20/2012 0630   K 4.1 02/20/2012 0630   CL 100 02/20/2012 0630   CO2 25 02/20/2012 0630   GLUCOSE 142* 02/20/2012 0630   BUN 17 02/20/2012 0630   CREATININE 0.81 02/20/2012 0630   CALCIUM 8.5 02/20/2012 0630   GFRNONAA >90 02/20/2012 0630   GFRAA >90 02/20/2012 0630       ASSESSMENT AND PLAN 63 y.o. year old male  has a past medical history of Seizure; Inflammatory arthritis; Premature atrial contractions; Anxiety; Obesity; Hypertension; and Generalized convulsive epilepsy without mention of intractable epilepsy (05/31/2013). here with:  1. seizures 2. Encounter for routine drug monitoring  Patient has been seizure free since his last visit in January. Patient should continue taking Dilantin as prescribed. I will refill today. I will check a Dilantin level today. Patient has been seizure free for the last 6 months he can resume operating a motor vehicle. Patient scored a  23/30 on the [facility] memory test. We will continue to monitor.  Patient should return in 6 months or sooner if needed  Butch PennyMegan Sheletha Bow, MSN, NP-C 12/29/2013, 2:00 PM [facility] [address], [address] [place], [place] [address] [phone]  Note: This document was prepared with digital dictation and possible smart phrase technology. Any transcriptional errors that result from this process are unintentional.

## 2013-12-30 LAB — PHENYTOIN LEVEL, TOTAL: PHENYTOIN LVL: 18.2 ug/mL (ref 10.0–20.0)

## 2014-01-01 NOTE — Progress Notes (Signed)
Quick Note:  Spoke with patient and informed him of normal Dilantin level, patient verbalized understanding, no further questions or concerns expressed. ______

## 2014-04-04 ENCOUNTER — Encounter: Payer: Self-pay | Admitting: Neurology

## 2014-04-10 ENCOUNTER — Encounter: Payer: Self-pay | Admitting: Neurology

## 2014-05-31 ENCOUNTER — Ambulatory Visit: Payer: 59 | Admitting: Nurse Practitioner

## 2014-07-02 ENCOUNTER — Ambulatory Visit: Payer: 59 | Admitting: Adult Health

## 2014-07-17 ENCOUNTER — Encounter: Payer: Self-pay | Admitting: Adult Health

## 2014-07-17 ENCOUNTER — Ambulatory Visit (INDEPENDENT_AMBULATORY_CARE_PROVIDER_SITE_OTHER): Payer: Medicare Other | Admitting: Adult Health

## 2014-07-17 VITALS — BP 130/78 | HR 83 | Ht 68.0 in | Wt 266.0 lb

## 2014-07-17 DIAGNOSIS — G40309 Generalized idiopathic epilepsy and epileptic syndromes, not intractable, without status epilepticus: Secondary | ICD-10-CM

## 2014-07-17 DIAGNOSIS — R413 Other amnesia: Secondary | ICD-10-CM

## 2014-07-17 DIAGNOSIS — Z5181 Encounter for therapeutic drug level monitoring: Secondary | ICD-10-CM

## 2014-07-17 NOTE — Patient Instructions (Signed)
Continue the phenytoin.  I will check blood work today.  Memory score is stable.

## 2014-07-17 NOTE — Progress Notes (Signed)
I have read the note, and I agree with the clinical assessment and plan.  Ceceilia Cephus KEITH   

## 2014-07-17 NOTE — Progress Notes (Addendum)
PATIENT: Richard Johnson DOB: [birth year]  REASON FOR VISIT: follow up- seizures,  Memory disturbance HISTORY FROM: patient  HISTORY OF PRESENT ILLNESS: Richard Johnson is a 64 year old male with a history of generalized seizures and memory disturbance. He returns today for followup. He is currently taken Dilantin and is tolerating it well. He states that he has not had any additional seizures since the last visit. He is able to complete all ADLs independently. He now operates a motor vehicle without difficulty. At the last visit the patient felt that his memory had been affected after the seizure. He scored a 23/30 on the MOCA. He states that his memory may have gotten slightly worse. Patient has issues with word finding during conversations. Denies having to give up anything due to his memory. He states that he has the most trouble with recall. States that if we were to tell him 5 words to remember he might remember 2. No new medical issue since last seen.  HISTORY 12/29/13: Richard Johnson is a 64 year old male with a history of generalized seizures. He returns today for followup. He is currently taken Dilantin and is tolerating it well. He states that he has not had any additional seizures since the last visit. His last event was in January. Patient is currently not driving and has not driven since his event in January. Patient states that he has noticed some mild ataxia with his gait. Denies any falls. He does not walk with an assistive device. He only has one new complaint and that is short term memory loss that started after his last seizure. He states that he noticed he was losing his medical terminology. He states that his wife has noticed that at times he gets stuck on words. Patient is also on oxycodone for back pain. He states that his medication could also be playing a role in his memory loss.  HISTORY 06/29/13 (CW):  Richard Johnson is a 64 year old right-handed white male with a history of generalized epilepsy.  The patient recently had seizures within the last several weeks associated with post ictal confusion lasting about 24 hours. The patient had a Dilantin level of around 10.5 when he was admitted to the hospital. The patient has had an increase on the Dilantin taking 250 mg in the morning and 300 mg in the evening. The patient is having some mild gait instability problems and some cognitive clouding. The patient has had soreness of the muscles and joints following the seizure. The patient has been placed on Nucynta. The patient returns to this office for further evaluation. The patient is not operating motor vehicle at this time.  REVIEW OF SYSTEMS: Out of a complete 14 system review of symptoms, the patient complains only of the following symptoms, and all other reviewed systems are negative.   activity change, eye discharge, daytime sleepiness, joint pain, back pain, neck pain, memory loss  ALLERGIES: Allergies  Allergen Reactions  . Contrast Media [Iodinated Diagnostic Agents] Anaphylaxis  . Dilaudid [Hydromorphone Hcl]     Severe anxiety  . Mysoline [Primidone]     Causes skin to peel off  . Penicillins Other (See Comments)    childhoold   . Phenobarbital     Causes skin to peel off    HOME MEDICATIONS: Outpatient Prescriptions Prior to Visit  Medication Sig Dispense Refill  . Diclofenac-Misoprostol 50-0.2 MG TBEC Take by mouth as needed. Taking as needed    . escitalopram (LEXAPRO) 20 MG tablet Take  1 tablet (20 mg total) by mouth daily. 90 tablet 1  . methocarbamol (ROBAXIN) 500 MG tablet Take 500 mg by mouth every 6 (six) hours as needed. For spasms    . metoprolol succinate (TOPROL-XL) 25 MG 24 hr tablet Take 25 mg by mouth daily.    Marland Kitchen. oxycodone (OXY-IR) 5 MG capsule Take 5 mg by mouth every 4 (four) hours as needed.     . phenytoin (DILANTIN) 100 MG ER capsule Two capsules in the morning and three capsules in the evening 150 capsule 11  . phenytoin (DILANTIN) 50 MG tablet Chew  0.5 tablets (25 mg total) by mouth daily. 15 tablet 5   No facility-administered medications prior to visit.    PAST MEDICAL HISTORY: Past Medical History  Diagnosis Date  . Seizure   . Inflammatory arthritis     patient still going through diagnosis  . Premature atrial contractions     does not see a cardiologist  . Anxiety   . Obesity   . Hypertension   . 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 Lumbar fusion  2001    Lumber 3-4, 4-5 with rods  . Appendectomy    . Cholecystectomy  1989    open  . Knee arthroscopy  2001    left  . Tonsillectomy    . Adenoidectomy      as child w/tonsillectomy.  . Reverse shoulder arthroplasty  02/19/2012    Procedure: REVERSE SHOULDER ARTHROPLASTY;  Surgeon: Verlee RossettiSteven R Norris, MD;  [facility];  Service: Orthopedics;  Laterality: Right;  right total reverse arthroplasty  . Hip arthroplasty Bilateral     FAMILY HISTORY: Family History  Problem Relation Age of Onset  . Stroke Mother   . Parkinson's disease Father   . Lung cancer Brother    PHYSICAL EXAM  Filed Vitals:   07/17/14 1337  BP: 130/78  Pulse: 83  Height: 5\' 8"  (1.727 m)  Weight: 266 lb (120.657 kg)   Body mass index is 40.45 kg/(m^2).  Generalized: Well developed, in no acute distress   Neurological examination  Mentation: Alert oriented to time, place, history taking. Follows all commands speech and language fluent.  MOCA 27 /30 Cranial nerve II-XII: Pupils were equal round reactive to light. Extraocular movements were full, visual field were full on confrontational test. Facial sensation and strength were normal. Uvula tongue midline. Head turning and shoulder shrug  were normal and symmetric. Motor: The motor testing reveals 5 over 5 strength of all 4 extremities. Good symmetric motor tone is noted throughout.  Sensory: Sensory testing is  intact to soft touch on all 4 extremities. No evidence of extinction is noted.  Coordination: Cerebellar testing reveals good finger-nose-finger and heel-to-shin bilaterally.  Gait and station: Gait is normal. Tandem gait is normal. Romberg is negative. No drift is seen.  Reflexes: Deep tendon reflexes are symmetric and normal bilaterally.     DIAGNOSTIC DATA (LABS, IMAGING, TESTING) - I reviewed patient records, labs, notes, testing and imaging myself where available.     ASSESSMENT AND PLAN 64 y.o. year old male  has a past medical history of Seizure; Inflammatory arthritis; Premature atrial contractions; Anxiety; Obesity; Hypertension; and Generalized convulsive epilepsy without mention of intractable epilepsy (05/31/2013). here with:  1. Seizures 2.  Memory disturbance    The patient's seizures have been controlled with phenytoin.  He will continue taking phenytoin.  His primary care has been refilling this.  I will check blood work today.  Patient's memory score has remained stable. We will continue to monitor this.  He will follow-up in 6 months or sooner if needed.     Butch Penny, MSN, NP-C 07/17/2014, 2:06 PM [facility] [address] [place], [place] [address] [phone]  Note: This document was prepared with digital dictation and possible smart phrase technology. Any transcriptional errors that result from this process are unintentional.

## 2014-07-18 ENCOUNTER — Telehealth: Payer: Self-pay

## 2014-07-18 LAB — CBC WITH DIFFERENTIAL/PLATELET
Basophils Absolute: 0 10*3/uL (ref 0.0–0.2)
Basos: 0 %
EOS ABS: 0.3 10*3/uL (ref 0.0–0.4)
Eos: 4 %
HEMATOCRIT: 39.6 % (ref 37.5–51.0)
Hemoglobin: 13.6 g/dL (ref 12.6–17.7)
IMMATURE GRANS (ABS): 0 10*3/uL (ref 0.0–0.1)
IMMATURE GRANULOCYTES: 0 %
Lymphocytes Absolute: 1.9 10*3/uL (ref 0.7–3.1)
Lymphs: 24 %
MCH: 31.9 pg (ref 26.6–33.0)
MCHC: 34.3 g/dL (ref 31.5–35.7)
MCV: 93 fL (ref 79–97)
MONOS ABS: 0.6 10*3/uL (ref 0.1–0.9)
Monocytes: 8 %
Neutrophils Absolute: 5 10*3/uL (ref 1.4–7.0)
Neutrophils Relative %: 64 %
PLATELETS: 318 10*3/uL (ref 150–379)
RBC: 4.26 x10E6/uL (ref 4.14–5.80)
RDW: 14.8 % (ref 12.3–15.4)
WBC: 7.9 10*3/uL (ref 3.4–10.8)

## 2014-07-18 LAB — COMPREHENSIVE METABOLIC PANEL
ALBUMIN: 4.1 g/dL (ref 3.6–4.8)
ALT: 25 IU/L (ref 0–44)
AST: 23 IU/L (ref 0–40)
Albumin/Globulin Ratio: 1.2 (ref 1.1–2.5)
Alkaline Phosphatase: 109 IU/L (ref 39–117)
BUN/Creatinine Ratio: 20 (ref 10–22)
BUN: 18 mg/dL (ref 8–27)
Bilirubin Total: 0.2 mg/dL (ref 0.0–1.2)
CALCIUM: 8.9 mg/dL (ref 8.6–10.2)
CHLORIDE: 101 mmol/L (ref 97–108)
CO2: 22 mmol/L (ref 18–29)
Creatinine, Ser: 0.92 mg/dL (ref 0.76–1.27)
GFR calc Af Amer: 101 mL/min/{1.73_m2} (ref >59)
GFR calc non Af Amer: 88 mL/min/{1.73_m2} (ref >59)
GLUCOSE: 120 mg/dL — AB (ref 65–99)
Globulin, Total: 3.3 g/dL (ref 1.5–4.5)
Potassium: 4 mmol/L (ref 3.5–5.2)
Sodium: 139 mmol/L (ref 134–144)
TOTAL PROTEIN: 7.4 g/dL (ref 6.0–8.5)

## 2014-07-18 LAB — PHENYTOIN LEVEL, TOTAL: PHENYTOIN LVL: 10.7 ug/mL (ref 10.0–20.0)

## 2014-07-18 NOTE — Telephone Encounter (Signed)
This is a normal level. As long as he is not having any seizures we can leave him on the generic brand. Please call and let him know. Thanks.

## 2014-07-18 NOTE — Progress Notes (Signed)
Quick Note: ° °Called and spoke to patient relayed normal labs. Patient understood. °______ °

## 2014-07-18 NOTE — Telephone Encounter (Signed)
Called patient  And talked with him about his lab's he asked what was the number for his dilantin stated 10.7. Patient stated I have been taking Phenytoin 100 mg two capsules in the morning and three in the evening. Do I need to go on brand name  again   so my level will be better ?   Please advise .

## 2014-07-19 ENCOUNTER — Telehealth: Payer: Self-pay

## 2014-07-19 NOTE — Telephone Encounter (Signed)
I called the patient. He is apprehensive about his Dilantin level because he had a break through seizure when his level was at 10. I will increase his Dilantin to 225 mg in the morning and 300 mg in the evening ( He was previously on this dosage). Patient would also like to switch back to the brand name but states that a form has to be filled out with his insurance. I will have Shanda Bumpsjessica look into this. We will recheck dilantin level in 3 weeks.

## 2014-07-19 NOTE — Telephone Encounter (Signed)
Called patient relayed Megan's message . Left him a voice mail if he need's use give us a call back if he needs to.

## 2014-07-19 NOTE — Telephone Encounter (Signed)
Patient called back today and stated. Dr. Anne HahnWillis wants his Dilantin Between 18-20. Patient is anxious that he is going to have a seizure. Patient want's to go back on Brand name. Megan Patient wanted me to send this message to Dr.Willis . Dr.Willis is out of the office until. 07-23-14. Patient states he would like a phone call today.

## 2014-07-20 ENCOUNTER — Other Ambulatory Visit: Payer: Self-pay

## 2014-07-20 ENCOUNTER — Telehealth: Payer: Self-pay

## 2014-07-20 MED ORDER — PHENYTOIN SODIUM EXTENDED 100 MG PO CAPS: ORAL_CAPSULE | ORAL | Status: DC

## 2014-07-20 MED ORDER — PHENYTOIN 50 MG PO CHEW: 25.0000 mg | CHEWABLE_TABLET | Freq: Every day | ORAL | Status: DC

## 2014-07-20 NOTE — Telephone Encounter (Signed)
I have contacted ins and provided all clinical info regarding Brand Name Dilantin Rx's.  Two requests are open since there are two different strengths.  Dilantin 100mg  Ref Key: [id number] and Dilantin 50mg  Ref Key: [id number]

## 2014-07-30 NOTE — Telephone Encounter (Signed)
[employer] has approved the request for coverage on Dilantin (both strengths) effective until 07/19/2015.

## 2014-08-13 ENCOUNTER — Telehealth: Payer: Self-pay | Admitting: Adult Health

## 2014-08-13 NOTE — Telephone Encounter (Signed)
I called the patient. His lab work completed in [place] shows a Dilantin of 19.5- which is in normal range. I left a message for him to call our office.

## 2014-08-14 NOTE — Telephone Encounter (Signed)
Spoke to patient - aware of results. 

## 2014-08-14 NOTE — Telephone Encounter (Signed)
Patient returned Aundra MilletMegan, NP call.  Please return call to [phone].

## 2015-01-23 ENCOUNTER — Ambulatory Visit (INDEPENDENT_AMBULATORY_CARE_PROVIDER_SITE_OTHER): Payer: Medicare Other | Admitting: Adult Health

## 2015-01-23 ENCOUNTER — Encounter: Payer: Self-pay | Admitting: Adult Health

## 2015-01-23 VITALS — BP 138/84 | HR 76 | Ht 68.0 in | Wt 267.0 lb

## 2015-01-23 DIAGNOSIS — R413 Other amnesia: Secondary | ICD-10-CM | POA: Diagnosis not present

## 2015-01-23 DIAGNOSIS — Z5181 Encounter for therapeutic drug level monitoring: Secondary | ICD-10-CM

## 2015-01-23 DIAGNOSIS — G40309 Generalized idiopathic epilepsy and epileptic syndromes, not intractable, without status epilepticus: Secondary | ICD-10-CM

## 2015-01-23 NOTE — Progress Notes (Signed)
I have read the note, and I agree with the clinical assessment and plan.  WILLIS,CHARLES KEITH   

## 2015-01-23 NOTE — Patient Instructions (Addendum)
Continue Dilantin Please have blood work completed- Dilantin level- have blood work done prior to morning dose.  Have most recent blood work sent to [place] as well.  If your symptoms worsen or you develop new symptoms please let us know.

## 2015-01-23 NOTE — Progress Notes (Signed)
PATIENT: Richard Johnson DOBelford Pascucci-1952  REASON FOR VISIT: follow up seizures, memory disturbance HISTORY FROM: patient  HISTORY OF PRESENT ILLNESS: Mr. Kroh  Is a 64 year old male with a history of generalized seizures and memory disturbance. He returns today for follow-up. He continues to take Dilantin and tolerating it well. At the last visit his Dilantin level was 10. The patient was concerned because in the past he had a breakthrough seizure when his Dilantin level was at this level. We increased his Dilantin. He is now taking 200 mg in the morning and 325 mg  In the evening. His repeat Dilantin level was 19.7. The patient reports that he is not had any seizure events since last visit. He is able to complete all ADLs independently. He operates a Librarian, academic without difficulty.  Denies any  Significant changes with his gait. He states at times he may feel a little off balance but this is not consistent. The patient does visit his dentist but not regularly. The patient still notices trouble with his memory. He states that he is forgetful. However he is able to recall things eventually. He reports that his memory trouble is not consistent rather intermittent. He states that he can be very frustrating at times he also states that if he was not already retired he would be worried that he would not be able to complete his job appropriately with these memory troubles. He returns today for an evaluation.  HISTORY  07/17/14: Mr. Dolce is a 64 year old male with a history of generalized seizures and memory disturbance. He returns today for followup. He is currently taken Dilantin and is tolerating it well. He states that he has not had any additional seizures since the last visit. He is able to complete all ADLs independently. He now operates a motor vehicle without difficulty. At the last visit the patient felt that his memory had been affected after the seizure. He scored a 23/30 on the MOCA. He states that  his memory may have gotten slightly worse. Patient has issues with word finding during conversations. Denies having to give up anything due to his memory. He states that he has the most trouble with recall. States that if we were to tell him 5 words to remember he might remember 2. No new medical issue since last seen.  HISTORY 12/29/13: Ms. Garron is a 64 year old male with a history of generalized seizures. He returns today for followup. He is currently taken Dilantin and is tolerating it well. He states that he has not had any additional seizures since the last visit. His last event was in January. Patient is currently not driving and has not driven since his event in January. Patient states that he has noticed some mild ataxia with his gait. Denies any falls. He does not walk with an assistive device. He only has one new complaint and that is short term memory loss that started after his last seizure. He states that he noticed he was losing his medical terminology. He states that his wife has noticed that at times he gets stuck on words. Patient is also on oxycodone for back pain. He states that his medication could also be playing a role in his memory loss.  HISTORY 06/29/13 (CW):  Mr. Bertone is a 64 year old right-handed white male with a history of generalized epilepsy. The patient recently had seizures within the last several weeks associated with post ictal confusion lasting about 24 hours. The patient had  a Dilantin level of around 10.5 when he was admitted to the hospital. The patient has had an increase on the Dilantin taking 250 mg in the morning and 300 mg in the evening. The patient is having some mild gait instability problems and some cognitive clouding. The patient has had soreness of the muscles and joints following the seizure. The patient has been placed on Nucynta. The patient returns to this office for further evaluation. The patient is not operating motor vehicle at this time.  REVIEW OF  SYSTEMS: Out of a complete 14 system review of symptoms, the patient complains only of the following symptoms, and all other reviewed systems are negative.   Eye discharge , back pain, aching muscles, walking difficulty, neck pain, depression, seizures, memory loss  ALLERGIES: Allergies  Allergen Reactions  . Contrast Media [Iodinated Diagnostic Agents] Anaphylaxis  . Dilaudid [Hydromorphone Hcl]     Severe anxiety  . Mysoline [Primidone]     Causes skin to peel off  . Penicillins Other (See Comments)    childhoold   . Phenobarbital     Causes skin to peel off    HOME MEDICATIONS: Outpatient Prescriptions Prior to Visit  Medication Sig Dispense Refill  . escitalopram (LEXAPRO) 20 MG tablet Take 1 tablet (20 mg total) by mouth daily. 90 tablet 1  . methocarbamol (ROBAXIN) 500 MG tablet Take 500 mg by mouth every 6 (six) hours as needed. For spasms    . metoprolol succinate (TOPROL-XL) 25 MG 24 hr tablet Take 25 mg by mouth daily.    Marland Kitchen oxycodone (OXY-IR) 5 MG capsule Take 5 mg by mouth every 4 (four) hours as needed.     . phenytoin (DILANTIN) 100 MG ER capsule Two capsules in the morning and three capsules in the evening 150 capsule 6  . phenytoin (DILANTIN) 50 MG tablet Chew 0.5 tablets (25 mg total) by mouth daily. 15 tablet 6  . Diclofenac-Misoprostol 50-0.2 MG TBEC Take by mouth as needed. Taking as needed     No facility-administered medications prior to visit.    PAST MEDICAL HISTORY: Past Medical History  Diagnosis Date  . Seizure   . Inflammatory arthritis     patient still going through diagnosis  . Premature atrial contractions     does not see a cardiologist  . Anxiety   . Obesity   . Hypertension   . 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 Lumbar fusion  2001    Lumber 3-4, 4-5 with rods  . Appendectomy    . Cholecystectomy  1989     open  . Knee arthroscopy  2001    left  . Tonsillectomy    . Adenoidectomy      as child w/tonsillectomy.  . Reverse shoulder arthroplasty  02/19/2012    Procedure: REVERSE SHOULDER ARTHROPLASTY;  Surgeon: Verlee Rossetti, MD;  Location: [facility] OR;  Service: Orthopedics;  Laterality: Right;  right total reverse arthroplasty  . Hip arthroplasty Bilateral     FAMILY HISTORY: Family History  Problem Relation Age of Onset  . Stroke Mother   . Parkinson's disease Father   . Lung cancer Brother     SOCIAL HISTORY: Social History   Social History  . Marital Status: Married    Spouse Name: N/A  . Number of Children: 2  . Years of Education: college   Occupational History  . nurse anesthesist  Social History Main Topics  . Smoking status: Never Smoker   . Smokeless tobacco: Never Used  . Alcohol Use: No  . Drug Use: No  . Sexual Activity: Not on file   Other Topics Concern  . Not on file   Social History Narrative   Patient is married [name]) and lives with his wife.     Patient has two one child deceased.    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   Patient has a Music therapist.   Patient is right-handed.            PHYSICAL EXAM  Filed Vitals:   01/23/15 1025  BP: 138/84  Pulse: 76  Height: 5\' 8"  (1.727 m)  Weight: 267 lb (121.11 kg)   Body mass index is 40.61 kg/(m^2).  Generalized: Well developed, in no acute distress   Neurological examination  Mentation: Alert oriented to time, place, history taking. Follows all commands speech and language fluent Cranial nerve II-XII: Pupils were equal round reactive to light. Extraocular movements were full, visual field were full on confrontational test. Facial sensation and strength were normal. Uvula tongue midline. Head turning and shoulder shrug  were normal and symmetric. Motor: The motor testing reveals 5 over 5 strength of all 4 extremities. Good symmetric motor tone is noted throughout.  Sensory:  Sensory testing is intact to soft touch on all 4 extremities. No evidence of extinction is noted.  Coordination: Cerebellar testing reveals good finger-nose-finger and heel-to-shin bilaterally.  Gait and station: Gait is normal. Tandem gait is normal. Romberg is negative. No drift is seen.  Reflexes: Deep tendon reflexes are symmetric and normal bilaterally.   DIAGNOSTIC DATA (LABS, IMAGING, TESTING) - I reviewed patient records, labs, notes, testing and imaging myself where available.  Lab Results  Component Value Date   WBC 7.9 07/17/2014   HGB 13.6 07/17/2014   HCT 39.6 07/17/2014   MCV 93 07/17/2014   PLT 318 07/17/2014      Component Value Date/Time   NA 139 07/17/2014 1432   NA 134* 02/20/2012 0630   K 4.0 07/17/2014 1432   CL 101 07/17/2014 1432   CO2 22 07/17/2014 1432   GLUCOSE 120* 07/17/2014 1432   GLUCOSE 142* 02/20/2012 0630   BUN 18 07/17/2014 1432   BUN 17 02/20/2012 0630   CREATININE 0.92 07/17/2014 1432   CALCIUM 8.9 07/17/2014 1432   PROT 7.4 07/17/2014 1432   AST 23 07/17/2014 1432   ALT 25 07/17/2014 1432   ALKPHOS 109 07/17/2014 1432   BILITOT 0.2 07/17/2014 1432   GFRNONAA 88 07/17/2014 1432   GFRAA 101 07/17/2014 1432      ASSESSMENT AND PLAN 64 y.o. year old male  has a past medical history of Seizure; Inflammatory arthritis; Premature atrial contractions; Anxiety; Obesity; Hypertension; and Generalized convulsive epilepsy without mention of intractable epilepsy (05/31/2013). here with:   1.  Seizures  2. Memory disturbance   Patient will continue on Dilantin 200 mg in the morning and 325 mg in the evening. The patient lives in stable and will have his Dilantin level checked and his primary care office. The patient did take his medication this morning. He will have this faxed to our office. The patient's memory has remained stable. His MMSE is 29/30 and MOCA is 27/30 was previously 23/30. I have recommended neuropsychological testing however the  patient would like to hold off on this for now. He will follow-up in 6 months with Dr. Anne Hahn.   Aundra Millet  Ethelene Browns, MSN, NP-C 01/23/2015, 11:05 AM [facility] [address], [address] [place], [place] [address] [phone]

## 2015-01-28 ENCOUNTER — Telehealth: Payer: Self-pay | Admitting: Adult Health

## 2015-01-28 NOTE — Telephone Encounter (Signed)
Called patient back and spoke with him Relayed Richard Johnson will call him back after patient's today. Patient was fine and understood process.

## 2015-01-28 NOTE — Telephone Encounter (Signed)
I called the patient left a message on his cell phone as well as his home phone. The patient had lab work completed by his PCP. His Dilantin level is elevated at 22.1. The patient's liver enzymes are in normal range. The rest of his lab work is unremarkable. I have called the patient. His Dilantin dose will need to be adjusted.

## 2015-01-28 NOTE — Telephone Encounter (Signed)
Patient returned Megan's call. Call patient back on [phone].

## 2015-01-28 NOTE — Telephone Encounter (Signed)
I called the patient. Left a message for him to return call.

## 2015-01-29 ENCOUNTER — Telehealth: Payer: Self-pay | Admitting: Adult Health

## 2015-01-29 NOTE — Telephone Encounter (Signed)
I spoke to the patient this morning. His Dilantin level was elevated at 22.1. The patient will decrease his Dilantin dosing. He'll take 2 tablets in the morning and 3 tablets in the evening. He will eliminate the 25 mg tablet at bedtime. Patient verbalized understanding. We will recheck Dilantin level in 3 weeks.

## 2015-01-29 NOTE — Telephone Encounter (Signed)
error 

## 2015-02-21 ENCOUNTER — Telehealth: Payer: Self-pay | Admitting: Adult Health

## 2015-02-21 NOTE — Telephone Encounter (Signed)
Spoke to patient. Gave instructions. Patient stated he would have lab work done tomorrow.

## 2015-02-21 NOTE — Telephone Encounter (Signed)
Please call the patient and remind him to have his dilantin level rechecked. He is not local so he has it completed close to home and then sends [place] the results. Thanks.

## 2015-03-12 ENCOUNTER — Telehealth: Payer: Self-pay | Admitting: Neurology

## 2015-03-12 NOTE — Telephone Encounter (Signed)
I called the patient. The dilantin level is 22.7, the patient normally runs around 20. It is not clear whether this medication was a true trough level. If not, he should stay on the same dose, if it was a true trough level, dropping off the 25 mg dosing at nighttime may be reasonable. The patient is on 200 mg in the morning, 300 mg in evening plus a half of a 50 mg tablet in the evening. The patient will call our office regarding this.

## 2015-03-12 NOTE — Telephone Encounter (Signed)
The patient indicates that he was off of the 25 mg Dilantin dosing, taking a total of 500 mg daily. The patient will go to 200 mg twice a day plus a 50 mg tablet in the evening of the Dilantin, we will recheck a blood in 3 weeks.

## 2015-03-12 NOTE — Telephone Encounter (Signed)
I called the patient. He stated that this lab was drawn in the morning before he took his am dose and it is a trough level. He also stated that this is after he had stopped the 25 mg at night (per [place] on 01/28/15).

## 2015-03-12 NOTE — Telephone Encounter (Addendum)
Pt returned call. Was told he would get a call back [phone]

## 2015-03-12 NOTE — Addendum Note (Signed)
Addended by: Stephanie AcreWILLIS, CHARLES on: 03/12/2015 05:27 PM   Modules accepted: Medications

## 2015-03-26 ENCOUNTER — Other Ambulatory Visit: Payer: Self-pay | Admitting: Adult Health

## 2015-03-26 NOTE — Telephone Encounter (Signed)
York Spanielharles K Willis, MD at 03/12/2015 5:25 PM     Status: Signed       Expand All Collapse All   The patient indicates that he was off of the 25 mg Dilantin dosing, taking a total of 500 mg daily. The patient will go to 200 mg twice a day plus a 50 mg tablet in the evening of the Dilantin, we will recheck a blood in 3 weeks.

## 2015-04-04 ENCOUNTER — Telehealth: Payer: Self-pay | Admitting: Neurology

## 2015-04-04 NOTE — Telephone Encounter (Signed)
Dilantin level done on November 16 is good, level is 17.4. No change in dosing. Chemistry profile was unremarkable.

## 2015-05-19 HISTORY — PX: ANTERIOR CERVICAL DECOMP/DISCECTOMY FUSION: SHX1161

## 2015-07-24 ENCOUNTER — Encounter: Payer: Self-pay | Admitting: Neurology

## 2015-07-24 ENCOUNTER — Ambulatory Visit (INDEPENDENT_AMBULATORY_CARE_PROVIDER_SITE_OTHER): Payer: Medicare Other | Admitting: Neurology

## 2015-07-24 VITALS — BP 158/88 | HR 88 | Resp 20 | Ht 69.0 in | Wt 265.0 lb

## 2015-07-24 DIAGNOSIS — G40309 Generalized idiopathic epilepsy and epileptic syndromes, not intractable, without status epilepticus: Secondary | ICD-10-CM | POA: Diagnosis not present

## 2015-07-24 DIAGNOSIS — M47812 Spondylosis without myelopathy or radiculopathy, cervical region: Secondary | ICD-10-CM | POA: Diagnosis not present

## 2015-07-24 MED ORDER — PHENYTOIN 50 MG PO CHEW: 50.0000 mg | CHEWABLE_TABLET | Freq: Every day | ORAL | Status: DC

## 2015-07-24 MED ORDER — PHENYTOIN 50 MG PO CHEW: 25.0000 mg | CHEWABLE_TABLET | Freq: Every day | ORAL | Status: DC

## 2015-07-24 MED ORDER — DILANTIN 100 MG PO CAPS: 200.0000 mg | ORAL_CAPSULE | Freq: Two times a day (BID) | ORAL | Status: DC

## 2015-07-24 NOTE — Progress Notes (Signed)
Reason for visit: Seizures  Richard Burciaga. is an 65 y.o. male  History of present illness:  Richard Johnson is a 65 year old gentleman with a history of seizures. His seizures have been well controlled since last seen, he had some mild Dilantin toxicity when last seen, his dosing was reduced from 500 mg daily to 450 mg today, his last level was 17. The patient feels well in this regard. He has developed a new problem with his neck. He has had some neck pain off and on in the past, he has been followed through the [facility] and he has received epidural steroid injections in the past without much benefit. He has had a significant worsening of his neck pain in January 2017. He now has pain down the right arm primarily, but to some degree down the left arm as well. The pain goes down to the right hand. He believes that there is some weakness of the right arm. He has shoulder pain. He has been seen by an orthopedic surgeon, MRI of the cervical spine was done, the disc is brought for my review. This appears to show evidence of some spinal stenosis at the C3, C4, C5, and C6 levels. No spinal cord impingement is seen. Neuroforaminal stenosis at these levels is seen bilaterally. The patient has been taking pain medications and muscle relaxants without much benefit. He has lost mobility with the neck, he has difficulty turning the head to the right more than to the left. He denies easy change in balance.  Past Medical History  Diagnosis Date  . Seizure (HCC)   . Inflammatory arthritis (HCC)     patient still going through diagnosis  . Premature atrial contractions     does not see a cardiologist  . Anxiety   . Obesity   . Hypertension   . 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 Lumbar fusion  2001    Lumber 3-4, 4-5 with rods  . Appendectomy    . Cholecystectomy  1989    open  . Knee  arthroscopy  2001    left  . Tonsillectomy    . Adenoidectomy      as child w/tonsillectomy.  . Reverse shoulder arthroplasty  02/19/2012    Procedure: REVERSE SHOULDER ARTHROPLASTY;  Surgeon: Verlee Rossetti, MD;  Location: [facility] OR;  Service: Orthopedics;  Laterality: Right;  right total reverse arthroplasty  . Hip arthroplasty Bilateral     Family History  Problem Relation Age of Onset  . Stroke Mother   . Parkinson's disease Father   . Lung cancer Brother     Social history:  reports that he has never smoked. He has never used smokeless tobacco. He reports that he does not drink alcohol or use illicit drugs.    Allergies  Allergen Reactions  . Contrast Media [Iodinated Diagnostic Agents] Anaphylaxis  . Dilaudid [Hydromorphone Hcl]     Severe anxiety  . Mysoline [Primidone]     Causes skin to peel off  . Penicillins Other (See Comments)    childhoold   . Phenobarbital     Causes skin to peel off    Medications:  Prior to Admission medications   Medication Sig Start Date End Date Taking? Authorizing Provider  atorvastatin (LIPITOR) 80 MG tablet  07/16/15  Yes Historical Provider, MD  DILANTIN 100 MG ER capsule Take 2 capsules (200 mg total)  by mouth 2 (two) times daily. 03/26/15  Yes York Spanielharles K De Libman, MD  DULoxetine (CYMBALTA) 60 MG capsule  07/12/15  Yes Historical Provider, MD  methocarbamol (ROBAXIN) 500 MG tablet Take 500 mg by mouth every 6 (six) hours as needed. For spasms   Yes Historical Provider, MD  metoprolol succinate (TOPROL-XL) 25 MG 24 hr tablet Take 25 mg by mouth daily.   Yes Historical Provider, MD  oxycodone (OXY-IR) 5 MG capsule Take 5 mg by mouth every 4 (four) hours as needed.  07/03/13  Yes Historical Provider, MD  phenytoin (DILANTIN) 50 MG tablet Chew 0.5 tablets (25 mg total) by mouth daily. Patient taking differently: Chew 50 mg by mouth at bedtime.  07/20/14  Yes Butch PennyMegan Millikan, NP    ROS:  Out of a complete 14 system review of symptoms, the  patient complains only of the following symptoms, and all other reviewed systems are negative.  Fatigue Insomnia Walking difficulty, neck pain, neck stiffness Numbness, seizures, weakness, tremors   Blood pressure 158/88, pulse 88, resp. rate 20, height 5\' 9"  (1.753 m), weight 265 lb (120.203 kg).  Physical Exam  General: The patient is alert and cooperative at the time of the examination. The patient is markedly obese.  Neuromuscular: The patient lacks about 40 of lateral rotation of the neck to the right, 20 to the left.  Skin: No significant peripheral edema is noted.   Neurologic Exam  Mental status: The patient is alert and oriented x 3 at the time of the examination. The patient has apparent normal recent and remote memory, with an apparently normal attention span and concentration ability.   Cranial nerves: Facial symmetry is present. Speech is normal, no aphasia or dysarthria is noted. Extraocular movements are full. Visual fields are full.  Motor: The patient has good strength in all 4 extremities. There is some give way weakness with testing of the right arm, no true weakness is seen.  Sensory examination: Soft touch sensation is symmetric on the face, arms, and legs.  Coordination: The patient has good finger-nose-finger and heel-to-shin bilaterally.  Gait and station: The patient has a normal gait. Tandem gait is unsteady. Romberg is negative. No drift is seen.  Reflexes: Deep tendon reflexes are symmetric, but are depressed.   Assessment/Plan:  1. History of seizures  2. Cervical spondylosis  The patient is having increased neck pain, some cervicogenic headache. He is having pain down the right greater than left arm. He is being followed by an orthopedic surgeon for this issue. We will set him up for an epidural steroid injection at the [facility]. He will follow-up in 6 months. Prescriptions were given for his Dilantin.  [name]. Keith Akeiba Axelson  MD 07/24/2015 8:02 PM  [facility] [address] [place], [place] [address]  Phone [phone] Fax [phone]

## 2015-07-24 NOTE — Patient Instructions (Signed)

## 2015-08-26 ENCOUNTER — Telehealth: Payer: Self-pay | Admitting: Neurology

## 2015-08-26 NOTE — Telephone Encounter (Signed)
I called the patient. I got a report of blood work. The Dilantin was low at 7.2. The patient believes that he may have missed a dose before the blood was drawn. His last level on this dose of 450 mg a day was 17. The patient will get the Dilantin level rechecked. He also had a drug screen showing positive for opiates with codeine, morphine, oxycodone, and Dilaudid. A conference of metabolic profile was done, normal liver enzymes. Hemoglobin A1c was 5.9.

## 2015-09-16 ENCOUNTER — Telehealth: Payer: Self-pay | Admitting: Neurology

## 2015-09-16 NOTE — Telephone Encounter (Signed)
I called patient. The Dilantin level has come up to 12.3. Patient apparently did miss a dose before the last blood test was drawn, no change in dosing.

## 2015-10-23 ENCOUNTER — Telehealth: Payer: Self-pay | Admitting: Neurology

## 2015-10-23 NOTE — Telephone Encounter (Signed)
[facility]oke to [facility] who reports that pt has 4 remaining refills on medication. Said that pt picked up a refill a few days ago. Rx does state "Brand Name Medically Necessary." However, brand name may not be covered by insurance. Left VM mssg for call back if further questions/concerns.

## 2015-10-23 NOTE — Telephone Encounter (Signed)
Patient requesting refill of DILANTIN 100 MG ER capsule Pharmacy: [id number] - [place], [place] - [address] Pt requesting it state Brand Name Medically Necessary

## 2015-11-01 ENCOUNTER — Encounter: Payer: Self-pay | Admitting: Neurology

## 2016-01-29 ENCOUNTER — Ambulatory Visit: Payer: Medicare Other | Admitting: Neurology

## 2016-03-17 ENCOUNTER — Ambulatory Visit: Payer: Medicare Other | Admitting: Neurology

## 2016-03-18 ENCOUNTER — Ambulatory Visit (INDEPENDENT_AMBULATORY_CARE_PROVIDER_SITE_OTHER): Payer: Medicare Other | Admitting: Neurology

## 2016-03-18 ENCOUNTER — Encounter: Payer: Self-pay | Admitting: Neurology

## 2016-03-18 VITALS — BP 137/84 | HR 80 | Ht 69.0 in | Wt 271.5 lb

## 2016-03-18 DIAGNOSIS — R5382 Chronic fatigue, unspecified: Secondary | ICD-10-CM | POA: Diagnosis not present

## 2016-03-18 DIAGNOSIS — E538 Deficiency of other specified B group vitamins: Secondary | ICD-10-CM

## 2016-03-18 DIAGNOSIS — G40309 Generalized idiopathic epilepsy and epileptic syndromes, not intractable, without status epilepticus: Secondary | ICD-10-CM | POA: Diagnosis not present

## 2016-03-18 DIAGNOSIS — Z5181 Encounter for therapeutic drug level monitoring: Secondary | ICD-10-CM | POA: Diagnosis not present

## 2016-03-18 MED ORDER — PHENYTOIN 50 MG PO CHEW: 50.0000 mg | CHEWABLE_TABLET | Freq: Every day | ORAL | 3 refills | Status: DC

## 2016-03-18 MED ORDER — DILANTIN 100 MG PO CAPS: 200.0000 mg | ORAL_CAPSULE | Freq: Two times a day (BID) | ORAL | 3 refills | Status: DC

## 2016-03-18 NOTE — Progress Notes (Signed)
Reason for visit: Seizures  Richard CaroliJoseph Millhouse Jr. is an 65 y.o. male  History of present illness:  Richard Johnson is a 65 year old right-handed white male with a history of seizures that have been well controlled on Dilantin. The patient has had cervical spine surgery done in August 2017, he claims his neck discomfort is somewhat better but he still has some residual pain and slight weakness in the arms. The patient is undergoing physical therapy to increase the range of movement of the neck. The patient reports some problems with mild gait instability, he has had some word finding problems recently. He reports chronic fatigue, he has some difficulty sleeping at night, he does snore. He was evaluated for sleep apnea 8 years ago and was told that this was a mild issue at that time. The patient has some low back pain, he reports no pain going down the legs. He takes oxycodone on occasion for discomfort. He recently had blood work that showed an elevated sedimentation rate and C-reactive protein. He believes that his sedimentation rate was around 54. The patient returns to this office for an evaluation. The patient may have an occasional myoclonic jerk during the day.  Past Medical History:  Diagnosis Date  . Anxiety   . 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 Hypertension   . Inflammatory arthritis    patient still going through diagnosis  . Obesity   . Premature atrial contractions    does not see a cardiologist  . Seizure [facility](HCC)     Past Surgical History:  Procedure Laterality Date  . ADENOIDECTOMY     as child w/tonsillectomy.  . ANTERIOR CERVICAL DECOMP/DISCECTOMY FUSION  2017  . APPENDECTOMY    . CHOLECYSTECTOMY  1989   open  . HIP ARTHROPLASTY Bilateral   . KNEE ARTHROSCOPY  2001   left  . LUMBAR FUSION  2001   Lumber 3-4, 4-5 with rods  . REVERSE SHOULDER ARTHROPLASTY  02/19/2012   Procedure: REVERSE SHOULDER ARTHROPLASTY;  Surgeon: Verlee RossettiSteven  R Norris, MD;  [facility];  Service: Orthopedics;  Laterality: Right;  right total reverse arthroplasty  . TONSILLECTOMY      Family History  Problem Relation Age of Onset  . Stroke Mother   . Parkinson's disease Father   . Lung cancer Brother     Social history:  reports that he has never smoked. He has never used smokeless tobacco. He reports that he does not drink alcohol or use drugs.    Allergies  Allergen Reactions  . Contrast Media [Iodinated Diagnostic Agents] Anaphylaxis  . Dilaudid [Hydromorphone Hcl]     Severe anxiety  . Mysoline [Primidone]     Causes skin to peel off  . Penicillins Other (See Comments)    childhoold   . Phenobarbital     Causes skin to peel off    Medications:  Prior to Admission medications   Medication Sig Start Date End Date Taking? Authorizing Provider  atorvastatin (LIPITOR) 80 MG tablet Take 80 mg by mouth daily at 6 PM.  07/16/15  Yes Historical Provider, MD  DILANTIN 100 MG ER capsule Take 2 capsules (200 mg total) by mouth 2 (two) times daily. Brand medically necessary 07/24/15  Yes York Spanielharles K Willis, MD  escitalopram (LEXAPRO) 20 MG tablet Take 20 mg by mouth at bedtime.  02/07/16  Yes Historical Provider, MD  metoprolol succinate (TOPROL-XL) 25 MG 24 hr tablet Take 25 mg by mouth daily.  Yes Historical Provider, MD  oxycodone (OXY-IR) 5 MG capsule Take 5 mg by mouth every 4 (four) hours as needed.  07/03/13  Yes Historical Provider, MD  phenytoin (DILANTIN) 50 MG tablet Chew 1 tablet (50 mg total) by mouth daily. 07/24/15  Yes York Spanielharles K Willis, MD  tiZANidine (ZANAFLEX) 2 MG tablet Take 2-4 mg by mouth every 8 (eight) hours as needed.  02/24/16  Yes Historical Provider, MD    ROS:  Out of a complete 14 system review of symptoms, the patient complains only of the following symptoms, and all other reviewed systems are negative.  Cough Insomnia, daytime sleepiness Joint pain, back pain, walking difficulty Seizures, weakness  Blood  pressure 137/84, pulse 80, height 5\' 9"  (1.753 m), weight 271 lb 8 oz (123.2 kg).  Physical Exam  General: The patient is alert and cooperative at the time of the examination. The patient is markedly obese.  Skin: No significant peripheral edema is noted.   Neurologic Exam  Mental status: The patient is alert and oriented x 3 at the time of the examination. The patient has apparent normal recent and remote memory, with an apparently normal attention span and concentration ability.   Cranial nerves: Facial symmetry is present. Speech is normal, no aphasia or dysarthria is noted. Extraocular movements are full. Visual fields are full.  Motor: The patient has good strength in all 4 extremities.  Sensory examination: Soft touch sensation is symmetric on the face, arms, and legs.  Coordination: The patient has good finger-nose-finger and heel-to-shin bilaterally.  Gait and station: The patient has a normal gait. Tandem gait is slightly unsteady. Romberg is negative. No drift is seen.  Reflexes: Deep tendon reflexes are symmetric, but are depressed.   Assessment/Plan:  1. History seizures, well controlled  2. Chronic fatigue  3. Mild memory disturbance, word finding problems  The patient will have some blood work today to evaluate for the memory issues. The patient will have a Dilantin level checked. He was given prescriptions for the Dilantin, and he will follow-up in 6 months. The patient will contact me if his memory continues to worsen.  [name]. Keith Willis MD 03/18/2016 11:33 AM  [facility] [address] [place], [place] [address]  Phone [phone] Fax [phone]

## 2016-03-19 LAB — VITAMIN B12: Vitamin B-12: 477 pg/mL (ref 211–946)

## 2016-03-19 LAB — B. BURGDORFI ANTIBODIES

## 2016-03-19 LAB — PHENYTOIN LEVEL, TOTAL: Phenytoin (Dilantin), Serum: 12 ug/mL (ref 10.0–20.0)

## 2016-03-19 LAB — ANA W/REFLEX: ANA: NEGATIVE

## 2016-03-19 LAB — TSH: TSH: 1.99 u[IU]/mL (ref 0.450–4.500)

## 2016-03-20 ENCOUNTER — Telehealth: Payer: Self-pay

## 2016-03-20 NOTE — Telephone Encounter (Signed)
-----   Message from York Spanielharles K Willis, MD sent at 03/19/2016  6:06 PM EDT ----- The blood work results are unremarkable. Dilantin is therapeutic, no change in dosing. Please call the patient. ----- Message ----- From: Nell RangeInterface, [facility] Lab Results In Sent: 03/19/2016   7:42 AM To: York Spanielharles K Willis, MD

## 2016-03-20 NOTE — Telephone Encounter (Signed)
Called pt w/ unremarkable lab results. Instructed to continue Dilantin at current dosage. Verbalized understanding and appreciation for call.

## 2016-09-24 ENCOUNTER — Ambulatory Visit (INDEPENDENT_AMBULATORY_CARE_PROVIDER_SITE_OTHER): Payer: Medicare Other | Admitting: Neurology

## 2016-09-24 ENCOUNTER — Encounter (INDEPENDENT_AMBULATORY_CARE_PROVIDER_SITE_OTHER): Payer: Self-pay

## 2016-09-24 ENCOUNTER — Encounter: Payer: Self-pay | Admitting: Neurology

## 2016-09-24 VITALS — BP 137/83 | HR 68 | Ht 69.0 in | Wt 279.5 lb

## 2016-09-24 DIAGNOSIS — G4769 Other sleep related movement disorders: Secondary | ICD-10-CM

## 2016-09-24 DIAGNOSIS — G40309 Generalized idiopathic epilepsy and epileptic syndromes, not intractable, without status epilepticus: Secondary | ICD-10-CM

## 2016-09-24 HISTORY — DX: Other sleep related movement disorders: G47.69

## 2016-09-24 MED ORDER — DILANTIN 100 MG PO CAPS: 200.0000 mg | ORAL_CAPSULE | Freq: Two times a day (BID) | ORAL | 3 refills | Status: DC

## 2016-09-24 MED ORDER — PRAMIPEXOLE DIHYDROCHLORIDE 0.125 MG PO TABS: 0.2500 mg | ORAL_TABLET | Freq: Every day | ORAL | 3 refills | Status: DC

## 2016-09-24 MED ORDER — PHENYTOIN 50 MG PO CHEW: 50.0000 mg | CHEWABLE_TABLET | Freq: Every day | ORAL | 3 refills | Status: DC

## 2016-09-24 NOTE — Progress Notes (Signed)
Faxed printed/signed rx Dilantin 100mg  ER capsule and phenytoin 50 mg tablet to pt pharmacy. Fax: [phone]. Received confirmation.

## 2016-09-24 NOTE — Patient Instructions (Signed)
    We will start mirapex at night for the nocturnal myoclonus. Start at one tablet at night, if this does not help, go to 2 tablets at night.  Mirapex (pramipexole) may result in confusion or hallucinations, dizziness, drowsiness, or nausea. If any significant side effects are noted, please contact our office.

## 2016-09-24 NOTE — Progress Notes (Signed)
Reason for visit: Seizures  Richard CaroliJoseph Ancheta Jr. is an 66 y.o. male  History of present illness:  Richard Johnson is a 66 year old right-handed white male with a history of seizures that have been under good control on Dilantin. He has not had any seizures since last seen, his Dilantin level in November 2017 was 12. The patient has tolerated Dilantin well. He has noted over many years that he has nocturnal myoclonus, he will jerk or twitch when he gets drowsy or sleepy, and his wife indicates that he twitches and jerks throughout the night. The patient does snore, but he feels rested during the day. The patient has had prior neck surgery, he still has some discomfort in the neck and shoulders and some arm discomfort at times. If he performs physical activity, he is limited to about 3 hours. If he does too much he will "pay for it" with increased soreness the next day. The patient believes that his memory issues have improved since last seen, he is having less problem with recall. He comes to this office for an evaluation.  Past Medical History:  Diagnosis Date  . Anxiety   . 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 Hypertension   . Inflammatory arthritis    patient still going through diagnosis  . Obesity   . Premature atrial contractions    does not see a cardiologist  . Seizure [facility](HCC)     Past Surgical History:  Procedure Laterality Date  . ADENOIDECTOMY     as child w/tonsillectomy.  . ANTERIOR CERVICAL DECOMP/DISCECTOMY FUSION  2017  . APPENDECTOMY    . CHOLECYSTECTOMY  1989   open  . HIP ARTHROPLASTY Bilateral   . KNEE ARTHROSCOPY  2001   left  . LUMBAR FUSION  2001   Lumber 3-4, 4-5 with rods  . REVERSE SHOULDER ARTHROPLASTY  02/19/2012   Procedure: REVERSE SHOULDER ARTHROPLASTY;  Surgeon: Verlee RossettiSteven R Norris, MD;  [facility];  Service: Orthopedics;  Laterality: Right;  right total reverse arthroplasty  . TONSILLECTOMY      Family  History  Problem Relation Age of Onset  . Stroke Mother   . Parkinson's disease Father   . Lung cancer Brother     Social history:  reports that he has never smoked. He has never used smokeless tobacco. He reports that he does not drink alcohol or use drugs.    Allergies  Allergen Reactions  . Contrast Media [Iodinated Diagnostic Agents] Anaphylaxis  . Dilaudid [Hydromorphone Hcl]     Severe anxiety  . Mysoline [Primidone]     Causes skin to peel off  . Penicillins Other (See Comments)    childhoold   . Phenobarbital     Causes skin to peel off    Medications:  Prior to Admission medications   Medication Sig Start Date End Date Taking? Authorizing Provider  atorvastatin (LIPITOR) 80 MG tablet Take 80 mg by mouth daily at 6 PM.  07/16/15   [provider]  DILANTIN 100 MG ER capsule Take 2 capsules (200 mg total) by mouth 2 (two) times daily. Brand medically necessary 03/18/16   York SpanielWillis, Elizah Mierzwa K, MD  escitalopram (LEXAPRO) 20 MG tablet Take 20 mg by mouth at bedtime.  02/07/16   [provider]  metoprolol succinate (TOPROL-XL) 25 MG 24 hr tablet Take 25 mg by mouth daily.    [provider]  oxycodone (OXY-IR) 5 MG capsule Take 5 mg by mouth every 4 (  four) hours as needed.  07/03/13   [provider]  phenytoin (DILANTIN) 50 MG tablet Chew 1 tablet (50 mg total) by mouth daily. 03/18/16   York Spaniel, MD  tiZANidine (ZANAFLEX) 2 MG tablet Take 2-4 mg by mouth every 8 (eight) hours as needed.  02/24/16   [provider]    ROS:  Out of a complete 14 system review of symptoms, the patient complains only of the following symptoms, and all other reviewed systems are negative.  Foot swelling Insomnia, snoring Joint pain, back pain, achy muscles, neck stiffness Myoclonus Depression  Blood pressure 137/83, pulse 68, height 5\' 9"  (1.753 m), weight 279 lb 8 oz (126.8 kg).  Physical Exam  General: The patient is alert and  cooperative at the time of the examination. The patient is markedly obese.  Skin: No significant peripheral edema is noted.   Neurologic Exam  Mental status: The patient is alert and oriented x 3 at the time of the examination. The patient has apparent normal recent and remote memory, with an apparently normal attention span and concentration ability.   Cranial nerves: Facial symmetry is present. Speech is normal, no aphasia or dysarthria is noted. Extraocular movements are full. Visual fields are full.  Motor: The patient has good strength in all 4 extremities.  Sensory examination: Soft touch sensation is symmetric on the face, arms, and legs.  Coordination: The patient has good finger-nose-finger and heel-to-shin bilaterally.  Gait and station: The patient has a normal gait. Tandem gait is normal. Romberg is negative. No drift is seen.  Reflexes: Deep tendon reflexes are symmetric.   Assessment/Plan:  1. History seizures, well controlled  2. Nocturnal myoclonus  The patient will be given Mirapex to take at night to see if this improves the jerks at night, if this is not effective, I will try clonazepam for the myoclonus. The patient will follow-up in 6 months, sooner if needed. A prescription was given for Dilantin as well.  [name] MD 09/24/2016 10:57 AM  [facility] [address] [place], [place] [address]  Phone [phone] Fax [phone]

## 2016-10-06 ENCOUNTER — Telehealth: Payer: Self-pay | Admitting: Neurology

## 2016-10-06 NOTE — Telephone Encounter (Signed)
Pt calling  To inform that from the [facility] website he found out that his Dilantin level is 8.6 he is asking for a call back

## 2016-10-06 NOTE — Telephone Encounter (Signed)
Called and spoke with patient. He stated PCP on 10/01/16 had dilantin level done. It came back at 8.6. He went today to have a re-draw. He requested results be sent to us first thing in the morning. He was unsure of what fax they are sending it to. I requested him to call place where he had labs drawn and have them fax results to [phone], Christiane Haattn Emma. He verbalized understanding.  Advised I will call him if I don't see results tomorrow.

## 2016-10-07 ENCOUNTER — Telehealth: Payer: Self-pay | Admitting: Neurology

## 2016-10-07 MED ORDER — DILANTIN 100 MG PO CAPS: ORAL_CAPSULE | ORAL | 3 refills | Status: DC

## 2016-10-07 NOTE — Telephone Encounter (Signed)
I have received blood work from the primary care physician. The patient has had a CBC with a white count 8.8, hemoglobin of 14.4, platelets of 306, MCV of 93. The comprehensive metabolic profile shows a BUN of 18, creatinine 0.85, sodium 138, potassium 4.8, chloride 102, total protein 7.7, albumin of 4.2, alk phosphatase is slightly elevated at 140 otherwise liver enzymes are unremarkable. Dilantin level was 8.6. TSH of 2.1, PSA of 1.7. Hemoglobin A1c is 5.8.  The patient apparently will have the Dilantin refill drawn. This study was done on 10/01/2016.

## 2016-10-07 NOTE — Telephone Encounter (Signed)
Faxed printed/signed rx Dilantin 100mg  ER cap to pt pharmacy. Fax: [phone]. Received confirmation.

## 2016-10-07 NOTE — Telephone Encounter (Signed)
Received faxed report. Gave to Dr Anne HahnWillis for review.

## 2016-10-07 NOTE — Telephone Encounter (Signed)
I called patient. The Dilantin level dropped even lower to 4.4. The patient has not missed any doses, he has not had any change in medication, he is on brand-name Dilantin.  There could potentially be a problem with the lab. We will go up on the Dilantin dosing to 500 mg total daily, he will stop the 50 mg tablet.  We will recheck the blood work in 3 weeks.  Mirapex was added recently, the does not appear to be an interaction between Dilantin Mirapex in regards to altering blood levels of the medication.

## 2016-10-27 ENCOUNTER — Other Ambulatory Visit: Payer: Self-pay | Admitting: Neurology

## 2016-10-27 ENCOUNTER — Telehealth: Payer: Self-pay | Admitting: Neurology

## 2016-10-27 NOTE — Telephone Encounter (Signed)
All the patient. We will recheck a Dilantin level, he wishes this to be done at [facility] [place].  We will use location on [address] phone [phone],  The fax number is [phone].

## 2016-10-28 LAB — PHENYTOIN LEVEL, TOTAL: PHENYTOIN (DILANTIN), SERUM: 12.3 ug/mL (ref 10.0–20.0)

## 2016-10-29 ENCOUNTER — Telehealth: Payer: Self-pay | Admitting: Neurology

## 2016-10-29 NOTE — Telephone Encounter (Signed)
Currently, the Dilantin level is therapeutic at 12.3, no change in dosing.

## 2017-04-01 ENCOUNTER — Telehealth: Payer: Self-pay | Admitting: Neurology

## 2017-04-01 NOTE — Telephone Encounter (Signed)
I received blood work through the office of Dr. Primitivo Gauzeatherine Kelly.  The patient had a conference of metabolic profile done on 31 March 2017.  BUN was 14, creatinine 0.75, sodium 140, potassium 4.3, chloride 103, calcium 9.0, total protein 6.9, albumin 4.1, alkaline phosphatase was slightly elevated at 128, AST and ALT were normal.  IMA globin A1c was 5.9, Dilantin level was 20.1.   I called the patient.  The patient indicates that he feels fine on the current dose of Dilantin, he has not felt sleepy or drunk or staggery.  We will not alter the dosing of Dilantin for now.  He will call me if he does not feel well in the future.

## 2017-04-05 ENCOUNTER — Ambulatory Visit (INDEPENDENT_AMBULATORY_CARE_PROVIDER_SITE_OTHER): Payer: Medicare Other | Admitting: Neurology

## 2017-04-05 ENCOUNTER — Encounter: Payer: Self-pay | Admitting: Neurology

## 2017-04-05 VITALS — BP 148/64 | HR 69 | Ht 69.0 in | Wt 282.5 lb

## 2017-04-05 DIAGNOSIS — G40309 Generalized idiopathic epilepsy and epileptic syndromes, not intractable, without status epilepticus: Secondary | ICD-10-CM

## 2017-04-05 NOTE — Progress Notes (Addendum)
Gave pt [employer] patient assistance program application in office today to bring home and complete. He will either drop off completed form to our office or fax back to [place] [facility] at [phone].       Reason for visit: History of seizures  Richard CaroliJoseph Province Jr. is an 66 y.o. male  History of present illness:  Mr. Richard Maffucciles is a 66 year old right-handed white male with a history of seizures that have been well controlled on Dilantin.  He just recently had a Dilantin level of 20.1.  The patient claims he feels well on this, he does not feel drunk or staggery.  He does have chronic issues with insomnia at night related to arthritic pain.  He seems to function well if he gets a good night sleep, if he does not he feels fatigued.  He has noted some problems with memory and concentration, problems with word finding, and difficulty with directions with driving.  His daughter voices concerns that this has worsened recently.  This was first mentioned about 1 year ago.  The patient currently is on no medications for memory.  He still operates motor vehicle without difficulty, he has not given up any activities of daily living because of memory issues.  Past Medical History:  Diagnosis Date  . Anxiety   . 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 Hypertension   . Inflammatory arthritis    patient still going through diagnosis  . Nocturnal myoclonus 09/24/2016  . Obesity   . Premature atrial contractions    does not see a cardiologist  . Seizure [facility](HCC)     Past Surgical History:  Procedure Laterality Date  . ADENOIDECTOMY     as child w/tonsillectomy.  . ANTERIOR CERVICAL DECOMP/DISCECTOMY FUSION  2017  . APPENDECTOMY    . CHOLECYSTECTOMY  1989   open  . HIP ARTHROPLASTY Bilateral   . KNEE ARTHROSCOPY  2001   left  . LUMBAR FUSION  2001   Lumber 3-4, 4-5 with rods  . REVERSE SHOULDER ARTHROPLASTY Right 02/19/2012   Performed by Verlee RossettiNorris, Steven R, [facility]   . TONSILLECTOMY      Family History  Problem Relation Age of Onset  . Stroke Mother   . Parkinson's disease Father   . Lung cancer Brother     Social history:  reports that  has never smoked. he has never used smokeless tobacco. He reports that he does not drink alcohol or use drugs.    Allergies  Allergen Reactions  . Contrast Media [Iodinated Diagnostic Agents] Anaphylaxis  . Dilaudid [Hydromorphone Hcl]     Severe anxiety  . Mysoline [Primidone]     Causes skin to peel off  . Penicillins Other (See Comments)    childhoold   . Phenobarbital     Causes skin to peel off    Medications:  Prior to Admission medications   Medication Sig Start Date End Date Taking? Authorizing Provider  atorvastatin (LIPITOR) 80 MG tablet Take 80 mg by mouth daily at 6 PM.  07/16/15  Yes [provider]  Cholecalciferol (VITAMIN D3) 10000 units TABS Take 2,000 Units daily by mouth.    Yes [provider]  DILANTIN 100 MG ER capsule Take 2 capsules in the morning, 3 in the evening. Brand medically necessary 10/07/16  Yes York SpanielWillis, Charles K, MD  escitalopram (LEXAPRO) 20 MG tablet Take 20 mg by mouth at bedtime.  02/07/16  Yes [provider]  methocarbamol (ROBAXIN) 500  MG tablet Take 500 mg 2 (two) times daily as needed by mouth.  02/22/17  Yes [provider]  metoprolol succinate (TOPROL-XL) 25 MG 24 hr tablet Take 50 mg daily by mouth.    Yes [provider]  oxycodone (OXY-IR) 5 MG capsule Take 5 mg every 6 (six) hours as needed by mouth.  07/03/13  Yes [provider]  pramipexole (MIRAPEX) 0.125 MG tablet Take 2 tablets (0.25 mg total) by mouth at bedtime. 09/24/16  Yes York SpanielWillis, Charles K, MD    ROS:  Out of a complete 14 system review of symptoms, the patient complains only of the following symptoms, and all other reviewed systems are negative.  Eye discharge Joint pain, back pain, aching muscles, neck pain Tremors Anxiety  Blood  pressure (!) 148/64, pulse 69, height 5\' 9"  (1.753 m), weight 282 lb 8 oz (128.1 kg).  Physical Exam  General: The patient is alert and cooperative at the time of the examination.  Skin: No significant peripheral edema is noted.   Neurologic Exam  Mental status: The patient is alert and oriented x 3 at the time of the examination. The patient has apparent normal recent and remote memory, with an apparently normal attention span and concentration ability.  Mini-Mental status examination done today shows a total score of 30/30.   Cranial nerves: Facial symmetry is present. Speech is normal, no aphasia or dysarthria is noted. Extraocular movements are full. Visual fields are full.  Motor: The patient has good strength in all 4 extremities.  Sensory examination: Soft touch sensation is symmetric on the face, arms, and legs.  Coordination: The patient has good finger-nose-finger and heel-to-shin bilaterally.  Gait and station: The patient has a normal gait. Tandem gait is very minimally unsteady. Romberg is negative. No drift is seen.  Reflexes: Deep tendon reflexes are symmetric.   Assessment/Plan:  1.  History of seizures, well controlled  2.  Reported mild memory disturbance  We will continue to follow the memory issues over time, we will not start a medication for memory currently, the patient will remain on Dilantin.  The problems with memory and concentration in part may be related to poor sleep habits, the Dilantin itself may also suppress cognitive functioning.  The patient will follow-up in 6 months.  Richard Palau. Keith Willis MD 04/05/2017 10:49 AM  [facility] [address] [place], [place] [address]  Phone [phone] Fax [phone]

## 2017-04-13 ENCOUNTER — Other Ambulatory Visit: Payer: Self-pay | Admitting: *Deleted

## 2017-04-13 ENCOUNTER — Telehealth: Payer: Self-pay | Admitting: *Deleted

## 2017-04-13 MED ORDER — DILANTIN 100 MG PO CAPS: ORAL_CAPSULE | ORAL | 3 refills | Status: DC

## 2017-04-13 NOTE — Telephone Encounter (Signed)
Faxed order to [employer] for new enrollment for dilantin . Telephone [phone].  Ok conformation.

## 2017-04-13 NOTE — Telephone Encounter (Signed)
Noted  

## 2017-04-13 NOTE — Telephone Encounter (Signed)
Gave completed signed [employer] patient assistance form for rx Dilantin back to [place] [facility].  Included printed rx Dilantin 100mg  ER capsule qty 450, 3 refills (Directions: 2 capsules in the am, 3 in the evening).

## 2017-04-20 ENCOUNTER — Encounter: Payer: Self-pay | Admitting: Neurology

## 2017-04-21 ENCOUNTER — Telehealth: Payer: Self-pay | Admitting: Neurology

## 2017-04-21 NOTE — Telephone Encounter (Signed)
Patient  Was approved for patient assistance. Patient assistance was delivered to the office 5 bottles of Dilantin. I called patient and left him a message and relayed he had to pick up this shipment because [employer] is still working in approval process of being shipped to his home address. Process could be 7-10 business days . Relayed office hours medication at the front desk for patient to pick up.     Please call me when you are on your last bottle of medication to allow me to process refill delivery takes 7-10 business days.   Thanks Darreld Mcleanana Cox  [facility]  Patient assistance program  [phone]     -  This letter is placed in Patient's bag.

## 2017-04-21 NOTE — Telephone Encounter (Signed)
Noted, thank you

## 2017-04-21 NOTE — Telephone Encounter (Signed)
[place] [facility] and if patient get's approved patient will have to drive to 2 hours to pick up his Dilantin. Dr. Anne HahnWillis has typed a letter to see if we can get RX sent to his home.  Letter sent to [employer].  Telephone [phone] - fax [phone].

## 2017-04-21 NOTE — Telephone Encounter (Signed)
Thank you!    Richard Johnson

## 2017-07-08 ENCOUNTER — Telehealth: Payer: Self-pay | Admitting: Neurology

## 2017-07-08 NOTE — Telephone Encounter (Signed)
I called the patient.  The Dilantin level is 14.1, no change in dosing.  This was done on 07 July 2017.

## 2017-07-27 ENCOUNTER — Telehealth: Payer: Self-pay | Admitting: Neurology

## 2017-07-27 NOTE — Telephone Encounter (Signed)
Called [employer] for refill Dilantin patient assistance [phone]  ID #  [id number]  .  Patient wanted RX delivered to his home  [employer] has not processed request yet. I relayed to [employer] to please process this was submitted back in Dec.

## 2017-07-28 NOTE — Telephone Encounter (Signed)
Patient called me back and I relayed to Him that I have to called [employer] back On Tuesday because [employer] still had not submitted request for his RX for Dilantin to be delivered to his Home. I will submit his his refill on Tues. 08/03/2017.

## 2017-08-03 ENCOUNTER — Encounter: Payer: Self-pay | Admitting: Neurology

## 2017-08-03 NOTE — Telephone Encounter (Addendum)
I have called and talked to [employer] and they relayed there appeal team is still working on change of address since December to his home address . Patient is needing his refill at this time , Because change of address the appeal team is asking me to send in a letter with refill and shipping address to the office .   August 03, 2017   Patient: Richard CaroliJoseph Stinson Jr.  Date of Birth: 1950-07-08  Date of Visit: 08/03/2017    To Whom It May Concern:  Appeals  Team   Its time for patient's refill of his Dilantin ID # [id number] -     DILANTIN 100 MG ER capsule       Sig: Take 2 capsules in the morning, 3 in the evening. Brand medically necessary      Please Delivery medication to Dr. Anne HahnWillis office until his address can be changed in your system please . [facility] [address]  [place] [address] . [phone] Wilnette Kalesana Cox Contact .    Change of address has been on file since December.  Richard CaroliJoseph Bunning Jr. [address] [place] [address]    If you have any questions or concerns, please don't hesitate to call.  Sincerely,Charles Corrinne EagleK  Willis MD

## 2017-08-17 NOTE — Telephone Encounter (Signed)
Patient was approved For medication to be delivered to his home patient assistance Dil[place]in [place] . [employer] . PAP Program.

## 2017-08-17 NOTE — Telephone Encounter (Signed)
Noted, thank you

## 2017-10-06 ENCOUNTER — Ambulatory Visit: Payer: Medicare Other | Admitting: Neurology

## 2017-10-13 ENCOUNTER — Ambulatory Visit: Payer: Medicare Other | Admitting: Neurology

## 2017-10-13 ENCOUNTER — Encounter: Payer: Self-pay | Admitting: Neurology

## 2017-10-13 ENCOUNTER — Other Ambulatory Visit: Payer: Self-pay

## 2017-10-13 VITALS — BP 123/69 | HR 80 | Ht 69.0 in | Wt 279.5 lb

## 2017-10-13 DIAGNOSIS — G40309 Generalized idiopathic epilepsy and epileptic syndromes, not intractable, without status epilepticus: Secondary | ICD-10-CM

## 2017-10-13 MED ORDER — PRAMIPEXOLE DIHYDROCHLORIDE 0.25 MG PO TABS: 0.2500 mg | ORAL_TABLET | Freq: Every day | ORAL | 3 refills | Status: DC

## 2017-10-13 NOTE — Progress Notes (Signed)
Reason for visit: Seizures  Richard Johnson. is an 67 y.o. male  History of present illness:  Richard Johnson is a 67 year old right-handed white male with a history of seizures that have been well controlled with Dilantin.  He is getting financial assistance through [employer] for this.  The patient has not had any further seizures since last seen, he is operating a motor vehicle without difficulty.  He takes Mirapex 0.25 mg at night for restless leg syndrome with good improvement.  The patient reports that he has some ongoing neck discomfort and arthritic pain that may not allow him to sleep well at night.  He returns to this office for an evaluation.  He will be getting blood work through his primary care physician to include a Dilantin level next week.  Past Medical History:  Diagnosis Date  . Anxiety   . 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 Hypertension   . Inflammatory arthritis    patient still going through diagnosis  . Nocturnal myoclonus 09/24/2016  . Obesity   . Premature atrial contractions    does not see a cardiologist  . Seizure [facility])     Past Surgical History:  Procedure Laterality Date  . ADENOIDECTOMY     as child w/tonsillectomy.  . ANTERIOR CERVICAL DECOMP/DISCECTOMY FUSION  2017  . APPENDECTOMY    . CHOLECYSTECTOMY  1989   open  . HIP ARTHROPLASTY Bilateral   . KNEE ARTHROSCOPY  2001   left  . LUMBAR FUSION  2001   Lumber 3-4, 4-5 with rods  . REVERSE SHOULDER ARTHROPLASTY  02/19/2012   Procedure: REVERSE SHOULDER ARTHROPLASTY;  Surgeon: Verlee Rossetti, MD;  Location: [facility] OR;  Service: Orthopedics;  Laterality: Right;  right total reverse arthroplasty  . TONSILLECTOMY      Family History  Problem Relation Age of Onset  . Stroke Mother   . Parkinson's disease Father   . Lung cancer Brother     Social history:  reports that he has never smoked. He has never used smokeless tobacco. He reports  that he does not drink alcohol or use drugs.    Allergies  Allergen Reactions  . Contrast Media [Iodinated Diagnostic Agents] Anaphylaxis  . Dilaudid [Hydromorphone Hcl]     Severe anxiety  . Mysoline [Primidone]     Causes skin to peel off  . Penicillins Other (See Comments)    childhoold   . Phenobarbital     Causes skin to peel off    Medications:  Prior to Admission medications   Medication Sig Start Date End Date Taking? Authorizing Provider  atorvastatin (LIPITOR) 80 MG tablet Take 80 mg by mouth daily at 6 PM.  07/16/15  Yes [provider]  Cholecalciferol (VITAMIN D3) 10000 units TABS Take 2,000 Units daily by mouth.    Yes [provider]  Diclofenac-miSOPROStol 50-0.2 MG TBEC Take 1 tablet by mouth at bedtime as needed.   Yes [provider]  DILANTIN 100 MG ER capsule Take 2 capsules in the morning, 3 in the evening. Brand medically necessary 04/13/17  Yes York Spaniel, MD  escitalopram (LEXAPRO) 20 MG tablet Take 20 mg by mouth at bedtime.  02/07/16  Yes [provider]  methocarbamol (ROBAXIN) 500 MG tablet Take 500 mg 2 (two) times daily as needed by mouth.  02/22/17  Yes [provider]  metoprolol succinate (TOPROL-XL) 25 MG 24 hr tablet Take 50 mg daily by mouth.  Yes [provider]  oxycodone (OXY-IR) 5 MG capsule Take 5 mg every 6 (six) hours as needed by mouth.  07/03/13  Yes [provider]  pramipexole (MIRAPEX) 0.25 MG tablet Take 1 tablet (0.25 mg total) by mouth at bedtime. 10/13/17   York Spaniel, MD    ROS:  Out of a complete 14 system review of symptoms, the patient complains only of the following symptoms, and all other reviewed systems are negative.  Eye discharge Leg swelling Insomnia, snoring Joint pain, back pain, aching muscles, neck pain Tremors, seizure history  Blood pressure 123/69, pulse 80, height  (1.753 m), weight 279 lb 8 oz (126.8 kg).  Physical  Exam  General: The patient is alert and cooperative at the time of the examination.  The patient is markedly obese.  Skin: No significant peripheral edema is noted.   Neurologic Exam  Mental status: The patient is alert and oriented x 3 at the time of the examination. The patient has apparent normal recent and remote memory, with an apparently normal attention span and concentration ability.   Cranial nerves: Facial symmetry is present. Speech is normal, no aphasia or dysarthria is noted. Extraocular movements are full. Visual fields are full.  Motor: The patient has good strength in all 4 extremities.  Sensory examination: Soft touch sensation is symmetric on the face, arms, and legs.  Coordination: The patient has good finger-nose-finger and heel-to-shin bilaterally.  Gait and station: The patient has a normal gait. Tandem gait is slightly unsteady. Romberg is negative. No drift is seen.  Reflexes: Deep tendon reflexes are symmetric.   Assessment/Plan:  1.  History of seizures, well controlled  2.  Restless leg syndrome  The patient will continue the Dilantin, a prescription was sent in for the Mirapex taking 0.25 mg tablet at night.  He will follow-up in 6 months.  He will have blood work in the near future.  [name] MD 10/13/2017 3:10 PM  [facility] [address] [place], [place] [address]  Phone [phone] Fax [phone]

## 2017-10-21 ENCOUNTER — Telehealth: Payer: Self-pay | Admitting: Neurology

## 2017-10-21 NOTE — Telephone Encounter (Signed)
Blood work recently done on 20 October 2017 reveals white blood count of 8.6, hemoglobin of 14.0, hematocrit 40.7, MCV 93, platelets of 298.  Glucose 109, BUN 17, creatinine 0.89, sodium 137, potassium 4.8, chloride 101, CO2 22, calcium 9.2, total protein 7.2, albumin 4.1, alk phosphatase is slightly elevated at 131, AST and ALT are normal.  Hemoglobin A1c of 6.0.  TSH of 2.87, Dilantin level 12.7, PSA of 1.9, vitamin D level is slightly low at 22.1.  CK enzyme level of 66.  Dilantin level is therapeutic, slight elevation in alkaline phosphatase is likely related to the Dilantin.  He should be on vitamin D supplementation.  Would recommend 1000 international units daily.

## 2017-10-22 NOTE — Telephone Encounter (Signed)
Called and spoke w/ patient about lab results per CW,MD note. He verbalized understanding.  He is already on Vit D 2000 units daily. He will continue to take this.

## 2017-11-09 ENCOUNTER — Telehealth: Payer: Self-pay | Admitting: Neurology

## 2017-11-09 NOTE — Telephone Encounter (Signed)
Called [employer] for refill of Patient's DILANTIN 100 MG ER capsule [employer]  [phone]-  ID #  [id number] - Dilantin . RX will be delivered to patient's home 7-10 business days . Patient is aware.

## 2018-03-08 NOTE — Telephone Encounter (Signed)
Called in refill of Dilantin --[employer]  [phone]-  ID #  [id number] - Dilantin   enrolled untill 05/17/2018  .   Delivery - 3-5 days to patient's Home - conformation - [id number] . Patient's renewal will be ending 05/17/2018 . Patient will be bring me his 10-40 and I will submit his new enrollment for 2020. Thanks Annabelle Harman.

## 2018-04-26 ENCOUNTER — Ambulatory Visit: Payer: Medicare Other | Admitting: Neurology

## 2018-05-24 ENCOUNTER — Other Ambulatory Visit: Payer: Self-pay | Admitting: Neurology

## 2018-05-24 MED ORDER — DILANTIN 100 MG PO CAPS: ORAL_CAPSULE | ORAL | 3 refills | Status: DC

## 2018-05-24 NOTE — Addendum Note (Signed)
Addended by: York Spaniel on: 05/24/2018 09:26 AM   Modules accepted: Orders

## 2018-05-24 NOTE — Telephone Encounter (Signed)
The Dilantin prescription was printed.

## 2018-05-24 NOTE — Telephone Encounter (Signed)
It's time for patient assistance renewal for DILANTIN 100 MG ER capsule 450 capsule . [employer] Patient assistance . Telephone [phone]- fax [phone]  Dr. Anne Hahn please sign paper work and print hard copy RX. Thanks Annabelle Harman.

## 2018-05-25 NOTE — Telephone Encounter (Signed)
Renewal has been sent to [employer] for Dilantin 100 mg ER . Patient assistance. Telephone [phone]- fax [phone]

## 2018-06-22 NOTE — Progress Notes (Signed)
PATIENT: Richard CaroliJoseph Kelter Jr. DOB: [birth year]  REASON FOR VISIT: follow up HISTORY FROM: patient  HISTORY OF PRESENT ILLNESS: Today 06/22/18  [name] Is a 68 year old male with a history of seizures that has been well controlled with Dilantin.  He reports his last seizure was 10 years ago.  He does report that he has to take brand-name Dilantin.  He denies any problems with his gait or any confusion.  He does complain of some dizziness that is intermittent.  He describes this as not overly significant.  He reports that he noticed that notices this most when he bends over quickly.  He is no longer taking Mirapex for restless legs.  He reports that his restless legs have not been a problem as of recently.  He does not sleep well at night however because he has chronic pain related to arthritis.  The pain is worse in the middle of night and in the morning.  He [place] [facility] [place] where he is prescribed oxycodone 5 mg.  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  He is driving a motor vehicle without difficulty and does all of his own ADLs.  He does report that he notices that his short-term memory is not as good as it used to be.  However this does not interfere with his daily activities.  He reports he is tolerating his Dilantin well and denies any problems related to the medication.  He does regularly take vitamin D.  He does report that he does not see a dentist regularly, however he is aware that this is suggested when taking Dilantin.  He does report that through his primary care doctor he will start using a CPAP as of recent. He presents today for follow-up.  HISTORY  Richard Johnson is a 68 year old right-handed white male with a history of seizures that have been well controlled with Dilantin.  He is getting financial assistance through [employer] for this.  The patient has not had any further seizures since last seen, he is  operating a motor vehicle without difficulty.  He takes Mirapex 0.25 mg at night for restless leg syndrome with good improvement.  The patient reports that he has some ongoing neck discomfort and arthritic pain that may not allow him to sleep well at night.  He returns to this office for an evaluation.  He will be getting blood work through his primary care physician to include a Dilantin level next week  REVIEW OF SYSTEMS: Out of a complete 14 system review of symptoms, the patient complains only of the following symptoms, and all other reviewed systems are negative.  Insomnia, daytime sleepiness, aching muscles, walking difficulty, neck stiffness, dizziness, nervousness/anxiety  ALLERGIES: Allergies  Allergen Reactions  . Contrast Media [Iodinated Diagnostic Agents] Anaphylaxis  . Dilaudid [Hydromorphone Hcl]     Severe anxiety  . Mysoline [Primidone]     Causes skin to peel off  . Penicillins Other (See Comments)    childhoold   . Phenobarbital     Causes skin to peel off    HOME MEDICATIONS: Outpatient Medications Prior to Visit  Medication Sig Dispense Refill  . atorvastatin (LIPITOR) 80 MG tablet Take 80 mg by mouth daily at 6 PM.     . Cholecalciferol (VITAMIN D3) 10000 units TABS Take 2,000 Units daily by mouth.     . Diclofenac-miSOPROStol 50-0.2 MG TBEC Take 1 tablet by mouth  at bedtime as needed.    Marland Kitchen DILANTIN 100 MG ER capsule Take 2 capsules in the morning, 3 in the evening. Brand medically necessary 450 capsule 3  . escitalopram (LEXAPRO) 20 MG tablet Take 20 mg by mouth at bedtime.     . methocarbamol (ROBAXIN) 500 MG tablet Take 500 mg 2 (two) times daily as needed by mouth.     . metoprolol succinate (TOPROL-XL) 25 MG 24 hr tablet Take 50 mg daily by mouth.     Marland Kitchen oxycodone (OXY-IR) 5 MG capsule Take 5 mg every 6 (six) hours as needed by mouth.     . pramipexole (MIRAPEX) 0.25 MG tablet Take 1 tablet (0.25 mg total) by mouth at bedtime. 90 tablet 3   No  facility-administered medications prior to visit.     PAST MEDICAL HISTORY: Past Medical History:  Diagnosis Date  . Anxiety   . 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 Hypertension   . Inflammatory arthritis    patient still going through diagnosis  . Nocturnal myoclonus 09/24/2016  . Obesity   . Premature atrial contractions    does not see a cardiologist  . Seizure [facility])     PAST SURGICAL HISTORY: Past Surgical History:  Procedure Laterality Date  . ADENOIDECTOMY     as child w/tonsillectomy.  . ANTERIOR CERVICAL DECOMP/DISCECTOMY FUSION  2017  . APPENDECTOMY    . CHOLECYSTECTOMY  1989   open  . HIP ARTHROPLASTY Bilateral   . KNEE ARTHROSCOPY  2001   left  . LUMBAR FUSION  2001   Lumber 3-4, 4-5 with rods  . REVERSE SHOULDER ARTHROPLASTY  02/19/2012   Procedure: REVERSE SHOULDER ARTHROPLASTY;  Surgeon: Verlee Rossetti, MD;  Location: [facility] OR;  Service: Orthopedics;  Laterality: Right;  right total reverse arthroplasty  . TONSILLECTOMY      FAMILY HISTORY: Family History  Problem Relation Age of Onset  . Stroke Mother   . Parkinson's disease Father   . Lung cancer Brother     SOCIAL HISTORY: Social History   Socioeconomic History  . Marital status: Married    Spouse name: Not on file  . Number of children: 2  . Years of education: College  . Highest education level: Not on file  Occupational History  . Occupation: Mining engineer  Social Needs  . Financial resource strain: Not on file  . Food insecurity:    Worry: Not on file    Inability: Not on file  . Transportation needs:    Medical: Not on file    Non-medical: Not on file  Tobacco Use  . Smoking status: Never Smoker  . Smokeless tobacco: Never Used  Substance and Sexual Activity  . Alcohol use: No    Alcohol/week: 0.0 standard drinks  . Drug use: No  . Sexual activity: Not on file  Lifestyle  . Physical activity:    Days per week: Not on file      Minutes per session: Not on file  . Stress: Not on file  Relationships  . Social connections:    Talks on phone: Not on file    Gets together: Not on file    Attends religious service: Not on file    Active member of club or organization: Not on file    Attends meetings of clubs or organizations: Not on file    Relationship status: Not on file  . Intimate partner violence:    Fear of current or ex  partner: Not on file    Emotionally abused: Not on file    Physically abused: Not on file    Forced sexual activity: Not on file  Other Topics Concern  . Not on file  Social History Narrative   Patient is married [name]) and lives with his wife.     Patient has two one child deceased.    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   Patient has a Music therapist.   Patient is right-handed.         PHYSICAL EXAM  There were no vitals filed for this visit. There is no height or weight on file to calculate BMI.  Generalized: Well developed, in no acute distress   Neurological examination  Mentation: Alert oriented to time, place, history taking. Follows all commands speech and language fluent Cranial nerve II-XII: Pupils were equal round reactive to light. Extraocular movements were full, visual field were full on confrontational test. Facial sensation and strength were normal. Uvula tongue midline. Head turning and shoulder shrug  were normal and symmetric. Motor: The motor testing reveals 5 over 5 strength of all 4 extremities. Good symmetric motor tone is noted throughout.  Sensory: Sensory testing is intact to soft touch on all 4 extremities. No evidence of extinction is noted.  Coordination: Cerebellar testing reveals good finger-nose-finger and heel-to-shin bilaterally.  Gait and station: Gait is normal. Tandem gait is unsteady. Romberg is negative. No drift is seen.  Reflexes: Deep tendon reflexes are symmetric and normal bilaterally.   DIAGNOSTIC DATA (LABS, IMAGING,  TESTING) - I reviewed patient records, labs, notes, testing and imaging myself where available.  Lab Results  Component Value Date   WBC 7.9 07/17/2014   HGB 13.6 07/17/2014   HCT 39.6 07/17/2014   MCV 93 07/17/2014   PLT 318 07/17/2014      Component Value Date/Time   NA 139 07/17/2014 1432   K 4.0 07/17/2014 1432   CL 101 07/17/2014 1432   CO2 22 07/17/2014 1432   GLUCOSE 120 (H) 07/17/2014 1432   GLUCOSE 142 (H) 02/20/2012 0630   BUN 18 07/17/2014 1432   CREATININE 0.92 07/17/2014 1432   CALCIUM 8.9 07/17/2014 1432   PROT 7.4 07/17/2014 1432   ALBUMIN 4.1 07/17/2014 1432   AST 23 07/17/2014 1432   ALT 25 07/17/2014 1432   ALKPHOS 109 07/17/2014 1432   BILITOT 0.2 07/17/2014 1432   GFRNONAA 88 07/17/2014 1432   GFRAA 101 07/17/2014 1432   No results found for: CHOL, HDL, LDLCALC, LDLDIRECT, TRIG, CHOLHDL No results found for: TIWP8K Lab Results  Component Value Date   VITAMINB12 477 03/18/2016   Lab Results  Component Value Date   TSH 1.990 03/18/2016      ASSESSMENT AND PLAN 69 y.o. year old male  has a past medical history of Anxiety, Generalized convulsive epilepsy without mention of intractable epilepsy (05/31/2013), Hypertension, Inflammatory arthritis, Nocturnal myoclonus (09/24/2016), Obesity, Premature atrial contractions, and Seizure (HCC). here with:  1. Seizure, controlled with Dilantin  Overall he is doing very well.  He has remained seizure-free for 10 years on Dilantin.  He is no longer taking Mirapex and he reports that his restless leg syndrome symptoms are under good control.  We did discuss that he should get up from a seated position slowly and be careful when he bends over to prevent sudden onset of dizziness.  He will continue his dose of Dilantin.  He is currently getting his medication from the drug company  financial assistance program.  He requires brand-name Dilantin.  He will continue taking a vitamin D supplement.  I advised him to maintain  dental follow-up.  I will check labs today.  He will follow-up in 6 months or sooner if needed.  I advised him to call our office if he has any new symptoms or any problems or concerns.  I spent 15 minutes with the patient. 50% of this time was spent discussing his plan of care.    Margie EgeSarah Makaelyn Aponte, AGNP-C, DNP 06/22/2018, 2:36 PM [facility] [address], [address] [place], [place] [address] [phone]

## 2018-06-27 ENCOUNTER — Encounter: Payer: Self-pay | Admitting: Neurology

## 2018-06-27 ENCOUNTER — Ambulatory Visit (INDEPENDENT_AMBULATORY_CARE_PROVIDER_SITE_OTHER): Payer: Medicare Other | Admitting: Neurology

## 2018-06-27 VITALS — BP 133/75 | HR 73 | Ht 69.0 in | Wt 278.4 lb

## 2018-06-27 DIAGNOSIS — Z5181 Encounter for therapeutic drug level monitoring: Secondary | ICD-10-CM | POA: Diagnosis not present

## 2018-06-27 DIAGNOSIS — G40309 Generalized idiopathic epilepsy and epileptic syndromes, not intractable, without status epilepticus: Secondary | ICD-10-CM | POA: Diagnosis not present

## 2018-06-27 NOTE — Progress Notes (Signed)
I have read the note, and I agree with the clinical assessment and plan.  Charles K Willis   

## 2018-06-27 NOTE — Patient Instructions (Signed)
We will check a dilantin level, CBC, CMP today.

## 2018-06-28 ENCOUNTER — Telehealth: Payer: Self-pay | Admitting: Neurology

## 2018-06-28 LAB — CBC WITH DIFFERENTIAL/PLATELET
BASOS ABS: 0 10*3/uL (ref 0.0–0.2)
BASOS: 0 %
EOS (ABSOLUTE): 0.2 10*3/uL (ref 0.0–0.4)
Eos: 2 %
HEMOGLOBIN: 13.9 g/dL (ref 13.0–17.7)
Hematocrit: 40 % (ref 37.5–51.0)
IMMATURE GRANS (ABS): 0 10*3/uL (ref 0.0–0.1)
IMMATURE GRANULOCYTES: 0 %
LYMPHS: 18 %
Lymphocytes Absolute: 1.5 10*3/uL (ref 0.7–3.1)
MCH: 31.9 pg (ref 26.6–33.0)
MCHC: 34.8 g/dL (ref 31.5–35.7)
MCV: 92 fL (ref 79–97)
Monocytes Absolute: 0.7 10*3/uL (ref 0.1–0.9)
Monocytes: 9 %
NEUTROS ABS: 5.6 10*3/uL (ref 1.4–7.0)
Neutrophils: 71 %
Platelets: 307 10*3/uL (ref 150–450)
RBC: 4.36 x10E6/uL (ref 4.14–5.80)
RDW: 12.6 % (ref 11.6–15.4)
WBC: 7.9 10*3/uL (ref 3.4–10.8)

## 2018-06-28 LAB — COMPREHENSIVE METABOLIC PANEL WITH GFR
ALT: 41 [IU]/L (ref 0–44)
AST: 35 [IU]/L (ref 0–40)
Albumin/Globulin Ratio: 1.4 (ref 1.2–2.2)
Albumin: 4 g/dL (ref 3.8–4.8)
Alkaline Phosphatase: 129 [IU]/L — ABNORMAL HIGH (ref 39–117)
BUN/Creatinine Ratio: 21 (ref 10–24)
BUN: 16 mg/dL (ref 8–27)
Bilirubin Total: 0.2 mg/dL (ref 0.0–1.2)
CO2: 21 mmol/L (ref 20–29)
Calcium: 8.7 mg/dL (ref 8.6–10.2)
Chloride: 102 mmol/L (ref 96–106)
Creatinine, Ser: 0.78 mg/dL (ref 0.76–1.27)
GFR calc Af Amer: 107 mL/min/{1.73_m2}
GFR calc non Af Amer: 93 mL/min/{1.73_m2}
Globulin, Total: 2.9 g/dL (ref 1.5–4.5)
Glucose: 134 mg/dL — ABNORMAL HIGH (ref 65–99)
Potassium: 4.2 mmol/L (ref 3.5–5.2)
Sodium: 140 mmol/L (ref 134–144)
Total Protein: 6.9 g/dL (ref 6.0–8.5)

## 2018-06-28 LAB — PHENYTOIN LEVEL, TOTAL: Phenytoin (Dilantin), Serum: 18.2 ug/mL (ref 10.0–20.0)

## 2018-06-28 NOTE — Telephone Encounter (Signed)
I called the patient and let him know that his labs were unremarkable with the exception of his alkaline phosphatase at 129, glucose 134. His dilantin was within normal limits at 18.2. He appreciated the call.

## 2018-08-22 ENCOUNTER — Telehealth: Payer: Self-pay

## 2018-08-22 NOTE — Telephone Encounter (Signed)
Pt is needing refill for DILANTIN 100 MG ER capsule. Please call to advise

## 2018-08-22 NOTE — Telephone Encounter (Signed)
Called Refill patient will get a delivery  -- [employer]  [phone]-  ID #  [id number] - Dilantin   enrolled untill 05/17/2018  Order # [id number] .

## 2018-12-06 ENCOUNTER — Telehealth: Payer: Self-pay | Admitting: Neurology

## 2018-12-06 NOTE — Telephone Encounter (Signed)
Patient is on his last bottle of Dilantin needs a refill

## 2018-12-06 NOTE — Telephone Encounter (Signed)
Refill will be sent to patient's home Dilantin order [id number] PAP

## 2019-01-19 ENCOUNTER — Encounter: Payer: Self-pay | Admitting: Neurology

## 2019-01-19 ENCOUNTER — Other Ambulatory Visit: Payer: Self-pay

## 2019-01-19 ENCOUNTER — Ambulatory Visit (INDEPENDENT_AMBULATORY_CARE_PROVIDER_SITE_OTHER): Payer: Medicare Other | Admitting: Neurology

## 2019-01-19 VITALS — BP 134/75 | HR 76 | Temp 97.8°F | Ht 68.0 in | Wt 273.0 lb

## 2019-01-19 DIAGNOSIS — G40309 Generalized idiopathic epilepsy and epileptic syndromes, not intractable, without status epilepticus: Secondary | ICD-10-CM | POA: Diagnosis not present

## 2019-01-19 NOTE — Progress Notes (Signed)
Reason for visit: Seizures  Richard Johnson. is an 68 y.o. male  History of present illness:  Richard Johnson is a 68 year old right-handed white male with a history of seizures, the patient is on brand-name Dilantin.  He had an excellent level of around 18 in February 2020.  He is on CPAP, he does report some mild memory issues but he also reports some fatigue.  The patient also complains of a lot of arthritic pain, he is not able to exercise and lose weight easily.  The patient is concerned that his program for financial aid for his Dilantin will be giving out at the end of the year.  Past Medical History:  Diagnosis Date  . Anxiety   . 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 Hypertension   . Inflammatory arthritis    patient still going through diagnosis  . Nocturnal myoclonus 09/24/2016  . Obesity   . Premature atrial contractions    does not see a cardiologist  . Seizure [facility])     Past Surgical History:  Procedure Laterality Date  . ADENOIDECTOMY     as child w/tonsillectomy.  . ANTERIOR CERVICAL DECOMP/DISCECTOMY FUSION  2017  . APPENDECTOMY    . CHOLECYSTECTOMY  1989   open  . HIP ARTHROPLASTY Bilateral   . KNEE ARTHROSCOPY  2001   left  . LUMBAR FUSION  2001   Lumber 3-4, 4-5 with rods  . REVERSE SHOULDER ARTHROPLASTY  02/19/2012   Procedure: REVERSE SHOULDER ARTHROPLASTY;  Surgeon: Augustin Schooling, MD;  Location: [place];  Service: Orthopedics;  Laterality: Right;  right total reverse arthroplasty  . TONSILLECTOMY      Family History  Problem Relation Age of Onset  . Stroke Mother   . Parkinson's disease Father   . Lung cancer Brother     Social history:  reports that he has never smoked. He has never used smokeless tobacco. He reports that he does not drink alcohol or use drugs.    Allergies  Allergen Reactions  . Contrast Media [Iodinated Diagnostic Agents] Anaphylaxis  . Dilaudid [Hydromorphone Hcl]     Severe  anxiety  . Mysoline [Primidone]     Causes skin to peel off  . Penicillins Other (See Comments)    childhoold   . Phenobarbital     Causes skin to peel off    Medications:  Prior to Admission medications   Medication Sig Start Date End Date Taking? Authorizing Provider  atorvastatin (LIPITOR) 80 MG tablet Take 80 mg by mouth daily at 6 PM.  07/16/15  Yes [provider]  Cholecalciferol (VITAMIN D3) 10000 units TABS Take 2,000 Units daily by mouth.    Yes [provider]  DILANTIN 100 MG ER capsule Take 2 capsules in the morning, 3 in the evening. Brand medically necessary 05/24/18  Yes Kathrynn Ducking, MD  escitalopram (LEXAPRO) 20 MG tablet Take 20 mg by mouth at bedtime.  02/07/16  Yes [provider]  methocarbamol (ROBAXIN) 750 MG tablet TAKE ONE TABLET BY MOUTH EVERY 8 HOURS AS NEEDED FOR SPASMS FOR 30 DAYS 09/23/18  Yes [provider]  metoprolol succinate (TOPROL-XL) 25 MG 24 hr tablet Take 50 mg daily by mouth.    Yes [provider]  oxycodone (OXY-IR) 5 MG capsule Take 5 mg by mouth every 12 (twelve) hours.  07/03/13  Yes [provider]    ROS:  Out of a complete 14 system review of  symptoms, the patient complains only of the following symptoms, and all other reviewed systems are negative.  Seizures Sleep apnea Joint pain  Blood pressure 134/75, pulse 76, temperature 97.8 F (36.6 C), temperature source Temporal, height 5\' 8"  (1.727 m), weight 273 lb (123.8 kg).  Physical Exam  General: The patient is alert and cooperative at the time of the examination.  The patient is markedly obese.  Skin: No significant peripheral edema is noted.   Neurologic Exam  Mental status: The patient is alert and oriented x 3 at the time of the examination. The patient has apparent normal recent and remote memory, with an apparently normal attention span and concentration ability.   Cranial nerves: Facial symmetry is present. Speech  is normal, no aphasia or dysarthria is noted. Extraocular movements are full. Visual fields are full.  Motor: The patient has good strength in all 4 extremities.  Sensory examination: Soft touch sensation is symmetric on the face, arms, and legs.  Coordination: The patient has good finger-nose-finger and heel-to-shin bilaterally.  Gait and station: The patient has a normal gait. Tandem gait is slightly unsteady. Romberg is negative. No drift is seen.  Reflexes: Deep tendon reflexes are symmetric.   Assessment/Plan:  1.  Seizure disorder, well controlled  The patient will continue the Dilantin for now, in the future we may potentially have to switch to generic and follow blood levels closely.  He will otherwise follow-up in 1 year, if we do have to go to generic, he will let me know.  [name]. Keith Willis MD 01/19/2019 2:37 PM  [facility] [address] [place], [place] [id number]  Phone [phone] Fax [phone]

## 2019-03-20 ENCOUNTER — Telehealth: Payer: Self-pay | Admitting: Neurology

## 2019-03-20 NOTE — Telephone Encounter (Signed)
Pt is requesting a refill from [facility] for his Dilantin 100mg 

## 2019-03-23 NOTE — Telephone Encounter (Signed)
Patient refill will be delivered  7-10 day . This will be last 30 day refill [employer] is not handling Dilantin any more . [employer] patient assistance program has mailed  Him places he can apply. I called patient with the information he was aware and he will submit information he received.

## 2019-05-16 ENCOUNTER — Telehealth: Payer: Self-pay | Admitting: Neurology

## 2019-05-16 MED ORDER — DILANTIN 100 MG PO CAPS: ORAL_CAPSULE | ORAL | 3 refills | Status: DC

## 2019-05-16 NOTE — Telephone Encounter (Signed)
Pharmacy called and stated they are unable to fill script, the insurance needs to be called due to patient being in the PAP program

## 2019-05-16 NOTE — Telephone Encounter (Addendum)
Chart reviewed and refill for dilantin is appropriate. [facility].

## 2019-05-16 NOTE — Telephone Encounter (Signed)
I reached out to the pt. He sts [employer] is no longer offering dilantin as a patient assistance option. Pt insurance is requiring a PA for brand name Dilantin.  Pt's insurance will be changing as of 05/19/2019 ( insurance cards not in system). Pt is going to call insurance and obtain the new member ID, Rx bin and [facility] #. Pt currently has over a 90 day supply of dilantin. He is going to call back with the new information from insurance.

## 2019-05-16 NOTE — Telephone Encounter (Signed)
Pt is needing a refill on his DILANTIN 100 MG ER capsule sent in to the [facility] in [place].

## 2019-05-29 ENCOUNTER — Telehealth: Payer: Self-pay

## 2019-05-29 NOTE — Telephone Encounter (Signed)
1) Medication(s) Requested (by name): DILANTIN 100 MG ER capsule   2) Pharmacy of Choice: [id number] [place] [address] [place], [place] - [address]  [address] [place] [address]   Patient called to inform his insurance does not require a PA for this medication and Is needing brand name . Please follow up

## 2019-05-29 NOTE — Telephone Encounter (Signed)
I returned the call to the patient. States his new insurance plan does not require a PA for brand name Dilantin.  He is aware that a prescription for 90-day x 3 refills was sent to [facility] on 05/16/2019.

## 2020-01-24 ENCOUNTER — Ambulatory Visit (INDEPENDENT_AMBULATORY_CARE_PROVIDER_SITE_OTHER): Payer: Medicare Other | Admitting: Neurology

## 2020-01-24 ENCOUNTER — Encounter: Payer: Self-pay | Admitting: Neurology

## 2020-01-24 ENCOUNTER — Other Ambulatory Visit: Payer: Self-pay

## 2020-01-24 ENCOUNTER — Ambulatory Visit: Payer: Medicare Other | Admitting: Neurology

## 2020-01-24 VITALS — BP 150/74 | HR 68 | Ht 68.0 in | Wt 273.0 lb

## 2020-01-24 DIAGNOSIS — G40309 Generalized idiopathic epilepsy and epileptic syndromes, not intractable, without status epilepticus: Secondary | ICD-10-CM | POA: Diagnosis not present

## 2020-01-24 MED ORDER — DILANTIN 100 MG PO CAPS: ORAL_CAPSULE | ORAL | 3 refills | Status: DC

## 2020-01-24 NOTE — Progress Notes (Signed)
PATIENT: Richard Johnson. DOB: 04-Aug-1950  REASON FOR VISIT: follow up HISTORY FROM: patient  HISTORY OF PRESENT ILLNESS: Today 01/24/20 Mr. Richard Johnson is a 69 year old male with history of seizures, is on brand-name Dilantin.  He continues to do quite well, no recurrent seizures.  He is on CPAP, reports mild short-term memory issues, his main problem is general arthritic pain.  He is on oxycodone for this.  He had a mechanical fall a few weeks ago, had a wrist fracture, has completed physical therapy.  When on generic Dilantin, indicates his blood levels dropped out.  He is a retired Scientist, clinical (histocompatibility and immunogenetics).  At times, he may have dizziness when closing his eyes, resolves when opening.  Presents today for evaluation unaccompanied.  HISTORY 01/19/2019 Dr. Anne Hahn: Mr. [name] is a 69 year old right-handed white male with a history of seizures, the patient is on brand-name Dilantin.  He had an excellent level of around 18 in February 2020.  He is on CPAP, he does report some mild memory issues but he also reports some fatigue.  The patient also complains of a lot of arthritic pain, he is not able to exercise and lose weight easily.  The patient is concerned that his program for financial aid for his Dilantin will be giving out at the end of the year.   REVIEW OF SYSTEMS: Out of a complete 14 system review of symptoms, the patient complains only of the following symptoms, and all other reviewed systems are negative.  n/a  ALLERGIES: Allergies  Allergen Reactions  . Contrast Media [Iodinated Diagnostic Agents] Anaphylaxis  . Dilaudid [Hydromorphone Hcl]     Severe anxiety  . Mysoline [Primidone]     Causes skin to peel off  . Penicillins Other (See Comments)    childhoold   . Phenobarbital     Causes skin to peel off    HOME MEDICATIONS: Outpatient Medications Prior to Visit  Medication Sig Dispense Refill  . atorvastatin (LIPITOR) 80 MG tablet Take 80 mg by mouth daily at 6 PM.     . escitalopram (LEXAPRO) 20  MG tablet Take 20 mg by mouth at bedtime.     . methocarbamol (ROBAXIN) 750 MG tablet TAKE ONE TABLET BY MOUTH EVERY 8 HOURS AS NEEDED FOR SPASMS FOR 30 DAYS    . metoprolol succinate (TOPROL-XL) 25 MG 24 hr tablet Take 50 mg daily by mouth.     Marland Kitchen oxycodone (OXY-IR) 5 MG capsule Take 5 mg by mouth every 12 (twelve) hours.     Marland Kitchen DILANTIN 100 MG ER capsule Take 2 capsules in the morning, 3 in the evening. Brand medically necessary 450 capsule 3  . Cholecalciferol (VITAMIN D3) 10000 units TABS Take 2,000 Units daily by mouth.      No facility-administered medications prior to visit.    PAST MEDICAL HISTORY: Past Medical History:  Diagnosis Date  . Anxiety   . 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 Hypertension   . Inflammatory arthritis    patient still going through diagnosis  . Nocturnal myoclonus 09/24/2016  . Obesity   . Premature atrial contractions    does not see a cardiologist  . Seizure [facility])     PAST SURGICAL HISTORY: Past Surgical History:  Procedure Laterality Date  . ADENOIDECTOMY     as child w/tonsillectomy.  . ANTERIOR CERVICAL DECOMP/DISCECTOMY FUSION  2017  . APPENDECTOMY    . CHOLECYSTECTOMY  1989   open  . HIP ARTHROPLASTY Bilateral   .  KNEE ARTHROSCOPY  2001   left  . LUMBAR FUSION  2001   Lumber 3-4, 4-5 with rods  . REVERSE SHOULDER ARTHROPLASTY  02/19/2012   Procedure: REVERSE SHOULDER ARTHROPLASTY;  Surgeon: Verlee Rossetti, MD;  Location: [facility] OR;  Service: Orthopedics;  Laterality: Right;  right total reverse arthroplasty  . TONSILLECTOMY      FAMILY HISTORY: Family History  Problem Relation Age of Onset  . Stroke Mother   . Parkinson's disease Father   . Lung cancer Brother     SOCIAL HISTORY: Social History   Socioeconomic History  . Marital status: Married    Spouse name: Not on file  . Number of children: 2  . Years of education: College  . Highest education level: Not on file    Occupational History  . Occupation: Nurse anesthesist  Tobacco Use  . Smoking status: Never Smoker  . Smokeless tobacco: Never Used  Substance and Sexual Activity  . Alcohol use: No    Alcohol/week: 0.0 standard drinks  . Drug use: No  . Sexual activity: Not on file  Other Topics Concern  . Not on file  Social History Narrative   Patient is married Richard Johnson) and lives with his wife.     Patient has two one child deceased.    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   Patient has a Music therapist.   Patient is right-handed.      Social Determinants of Health   Financial Resource Strain:   . Difficulty of Paying Living Expenses: Not on file  Food Insecurity:   . Worried About Programme researcher, broadcasting/film/video in the Last Year: Not on file  . Ran Out of Food in the Last Year: Not on file  Transportation Needs:   . Lack of Transportation (Medical): Not on file  . Lack of Transportation (Non-Medical): Not on file  Physical Activity:   . Days of Exercise per Week: Not on file  . Minutes of Exercise per Session: Not on file  Stress:   . Feeling of Stress : Not on file  Social Connections:   . Frequency of Communication with Friends and Family: Not on file  . Frequency of Social Gatherings with Friends and Family: Not on file  . Attends Religious Services: Not on file  . Active Member of Clubs or Organizations: Not on file  . Attends Banker Meetings: Not on file  . Marital Status: Not on file  Intimate Partner Violence:   . Fear of Current or Ex-Partner: Not on file  . Emotionally Abused: Not on file  . Physically Abused: Not on file  . Sexually Abused: Not on file   PHYSICAL EXAM  Vitals:   01/24/20 1335  BP: (!) 150/74  Pulse: 68  Weight: 273 lb (123.8 kg)  Height: 5\' 8"  (1.727 m)   Body mass index is 41.51 kg/m.  Generalized: Well developed, in no acute distress   Neurological examination  Mentation: Alert oriented to time, place, history taking.  Follows all commands speech and language fluent Cranial nerve II-XII: Pupils were equal round reactive to light. Extraocular movements were full, visual field were full on confrontational test. Facial sensation and strength were normal.  Head turning and shoulder shrug  were normal and symmetric. Motor: The motor testing reveals 5 over 5 strength of all 4 extremities. Good symmetric motor tone is noted throughout.  Sensory: Sensory testing is intact to soft touch on all 4 extremities. No evidence of extinction  is noted.  Coordination: Cerebellar testing reveals good finger-nose-finger and heel-to-shin bilaterally.  Gait and station: Gait is slightly wide-based, but steady. Reflexes: Deep tendon reflexes are symmetric and normal bilaterally.   DIAGNOSTIC DATA (LABS, IMAGING, TESTING) - I reviewed patient records, labs, notes, testing and imaging myself where available.  Lab Results  Component Value Date   WBC 7.9 06/27/2018   HGB 13.9 06/27/2018   HCT 40.0 06/27/2018   MCV 92 06/27/2018   PLT 307 06/27/2018      Component Value Date/Time   NA 140 06/27/2018 1420   K 4.2 06/27/2018 1420   CL 102 06/27/2018 1420   CO2 21 06/27/2018 1420   GLUCOSE 134 (H) 06/27/2018 1420   GLUCOSE 142 (H) 02/20/2012 0630   BUN 16 06/27/2018 1420   CREATININE 0.78 06/27/2018 1420   CALCIUM 8.7 06/27/2018 1420   PROT 6.9 06/27/2018 1420   ALBUMIN 4.0 06/27/2018 1420   AST 35 06/27/2018 1420   ALT 41 06/27/2018 1420   ALKPHOS 129 (H) 06/27/2018 1420   BILITOT 0.2 06/27/2018 1420   GFRNONAA 93 06/27/2018 1420   GFRAA 107 06/27/2018 1420   No results found for: CHOL, HDL, LDLCALC, LDLDIRECT, TRIG, CHOLHDL No results found for: BZJI9C Lab Results  Component Value Date   VITAMINB12 477 03/18/2016   Lab Results  Component Value Date   TSH 1.990 03/18/2016      ASSESSMENT AND PLAN 69 y.o. year old male  has a past medical history of Anxiety, Generalized convulsive epilepsy without mention of  intractable epilepsy (05/31/2013), Hypertension, Inflammatory arthritis, Nocturnal myoclonus (09/24/2016), Obesity, Premature atrial contractions, and Seizure (HCC). here with:  1.  Seizure disorder, well-controlled  -He requires brand-name -Continue Dilantin ER 100 mg capsule, 2 in the morning, 3 in the evening -Check routine blood work today -Call for seizure activity, follow-up in 1 year  I spent 20 minutes of face-to-face and non-face-to-face time with patient.  This included previsit chart review, lab review, study review, order entry, electronic health record documentation, patient education.  Margie Ege, AGNP-C, DNP 01/24/2020, 2:07 PM [facility] [address] [place], [place] [address] [phone]

## 2020-01-24 NOTE — Patient Instructions (Signed)
Continue current medications Check routine blood work today Call for seizures  See you back in 1 year

## 2020-01-25 ENCOUNTER — Telehealth: Payer: Self-pay | Admitting: Neurology

## 2020-01-25 DIAGNOSIS — G40309 Generalized idiopathic epilepsy and epileptic syndromes, not intractable, without status epilepticus: Secondary | ICD-10-CM

## 2020-01-25 DIAGNOSIS — Z5181 Encounter for therapeutic drug level monitoring: Secondary | ICD-10-CM

## 2020-01-25 LAB — PHENYTOIN LEVEL, TOTAL: Phenytoin (Dilantin), Serum: 21 ug/mL (ref 10.0–20.0)

## 2020-01-25 LAB — CBC WITH DIFFERENTIAL/PLATELET
Basophils Absolute: 0 10*3/uL (ref 0.0–0.2)
Basos: 1 %
EOS (ABSOLUTE): 0.3 10*3/uL (ref 0.0–0.4)
Eos: 3 %
Hematocrit: 41.2 % (ref 37.5–51.0)
Hemoglobin: 13.8 g/dL (ref 13.0–17.7)
Immature Grans (Abs): 0 10*3/uL (ref 0.0–0.1)
Immature Granulocytes: 0 %
Lymphocytes Absolute: 1.9 10*3/uL (ref 0.7–3.1)
Lymphs: 22 %
MCH: 32 pg (ref 26.6–33.0)
MCHC: 33.5 g/dL (ref 31.5–35.7)
MCV: 96 fL (ref 79–97)
Monocytes Absolute: 0.8 10*3/uL (ref 0.1–0.9)
Monocytes: 9 %
Neutrophils Absolute: 5.8 10*3/uL (ref 1.4–7.0)
Neutrophils: 65 %
Platelets: 274 10*3/uL (ref 150–450)
RBC: 4.31 x10E6/uL (ref 4.14–5.80)
RDW: 12.4 % (ref 11.6–15.4)
WBC: 8.8 10*3/uL (ref 3.4–10.8)

## 2020-01-25 LAB — COMPREHENSIVE METABOLIC PANEL
ALT: 25 IU/L (ref 0–44)
AST: 21 IU/L (ref 0–40)
Albumin/Globulin Ratio: 1.5 (ref 1.2–2.2)
Albumin: 4.3 g/dL (ref 3.8–4.8)
Alkaline Phosphatase: 118 IU/L (ref 48–121)
BUN/Creatinine Ratio: 17 (ref 10–24)
BUN: 16 mg/dL (ref 8–27)
Bilirubin Total: <0.2 mg/dL (ref 0.0–1.2)
CO2: 26 mmol/L (ref 20–29)
Calcium: 9.2 mg/dL (ref 8.6–10.2)
Chloride: 103 mmol/L (ref 96–106)
Creatinine, Ser: 0.92 mg/dL (ref 0.76–1.27)
GFR calc Af Amer: 98 mL/min/{1.73_m2} (ref >59)
GFR calc non Af Amer: 85 mL/min/{1.73_m2} (ref >59)
Globulin, Total: 2.8 g/dL (ref 1.5–4.5)
Glucose: 103 mg/dL — ABNORMAL HIGH (ref 65–99)
Potassium: 4.7 mmol/L (ref 3.5–5.2)
Sodium: 141 mmol/L (ref 134–144)
Total Protein: 7.1 g/dL (ref 6.0–8.5)

## 2020-01-25 NOTE — Telephone Encounter (Signed)
Pt called back.  I relayed dilantin level 21.0. (not trough level).  Would like to repeat at [facility] level.  Pt understood.  Will use [facility] in [place], [place] at facility on [address] [phone]. Order placed, once signed will mail to pt and he will go and have done.

## 2020-01-25 NOTE — Progress Notes (Signed)
I have read the note, and I agree with the clinical assessment and plan.  Amonie Wisser K Wolfgang Finigan   

## 2020-01-25 NOTE — Addendum Note (Signed)
Addended by: Guy Begin on: 01/25/2020 01:47 PM   Modules accepted: Orders

## 2020-01-25 NOTE — Telephone Encounter (Signed)
Patient was seen yesterday, is a stable seizure patient on Dilantin.  Dilantin level returned elevated 21.0, I do not think level was trough.  Can we repeat this, he lives in [place], see if his PCP will be willing to order trough level for review. The patient is savvy, is retired Scientist, clinical (histocompatibility and immunogenetics).  CBC, CMP were unremarkable.

## 2020-01-25 NOTE — Telephone Encounter (Signed)
LMVM for pt to return call for lab results.  

## 2020-02-06 ENCOUNTER — Other Ambulatory Visit: Payer: Self-pay | Admitting: Neurology

## 2020-02-07 LAB — PHENYTOIN LEVEL, TOTAL: Phenytoin (Dilantin), Serum: 18.4 ug/mL (ref 10.0–20.0)

## 2020-03-06 ENCOUNTER — Telehealth: Payer: Self-pay | Admitting: Neurology

## 2020-03-06 NOTE — Telephone Encounter (Signed)
Called pt and LMVM for him to call me back about his dilantin.

## 2020-03-06 NOTE — Telephone Encounter (Signed)
Pt is asking to speak with Dr Anne Hahn re: a request to change his prescription of DILANTIN 100 MG ER capsule due to significant distress he is having as a result of this medication, please call

## 2020-03-07 NOTE — Telephone Encounter (Signed)
I called the patient.  Over the last 4 weeks he has had some burning discomfort in the stomach that seems to be exacerbated when he takes his Dilantin medication.  He has tried to split dose of Dilantin which seems to help some but he is still having burning.  He has been on Dilantin for decades, I would not rush to blame the Dilantin on the symptoms, the patient sounds like he may have reflux issues or gastritis.  I have indicated that he may go on Prilosec, let his primary doctor know about his symptoms.  Stay on the Dilantin dosing for now.

## 2020-06-01 ENCOUNTER — Other Ambulatory Visit: Payer: Self-pay | Admitting: Neurology

## 2020-06-16 ENCOUNTER — Other Ambulatory Visit: Payer: Self-pay | Admitting: Neurology

## 2020-08-20 ENCOUNTER — Other Ambulatory Visit: Payer: Self-pay | Admitting: Neurology

## 2021-01-07 ENCOUNTER — Telehealth: Payer: Self-pay | Admitting: Neurology

## 2021-01-07 NOTE — Telephone Encounter (Signed)
I have received blood work that was done through the primary care physician on 16 December 2020.  CBC shows a white count of 8.0, hemoglobin of 13.0, hematocrit 38.1, MCV 93, platelets of 254.  Chemistry panel reveals glucose of 109, BUN of 13, creatinine of 0.9, estimated GFR of 92.  Sodium 140, potassium 5.0, chloride of 104, CO2 22, protein 7.2, albumin 3.9, alkaline phosphatase of 126, AST of 22, ALT of 25.  Total cholesterol 141, triglycerides 124, HDL 45, VLDL of 22, LDL 74.  Hemoglobin A1c of 5.9.  PSA 2.2.  Vitamin D level 17.5.

## 2021-01-23 ENCOUNTER — Ambulatory Visit: Payer: Medicare Other | Admitting: Neurology

## 2021-01-30 ENCOUNTER — Ambulatory Visit: Payer: Medicare Other | Admitting: Neurology

## 2021-02-04 ENCOUNTER — Ambulatory Visit: Payer: Medicare Other | Admitting: Neurology

## 2021-02-04 ENCOUNTER — Encounter: Payer: Self-pay | Admitting: Neurology

## 2021-02-04 VITALS — BP 147/79 | HR 64 | Ht 68.0 in | Wt 273.8 lb

## 2021-02-04 DIAGNOSIS — G40309 Generalized idiopathic epilepsy and epileptic syndromes, not intractable, without status epilepticus: Secondary | ICD-10-CM

## 2021-02-04 DIAGNOSIS — E538 Deficiency of other specified B group vitamins: Secondary | ICD-10-CM | POA: Diagnosis not present

## 2021-02-04 DIAGNOSIS — R202 Paresthesia of skin: Secondary | ICD-10-CM | POA: Diagnosis not present

## 2021-02-04 MED ORDER — DILANTIN 100 MG PO CAPS: ORAL_CAPSULE | ORAL | 3 refills | Status: DC

## 2021-02-04 NOTE — Progress Notes (Signed)
Reason for visit: History of seizures, mild balance problems  Richard Wanat. is an 70 y.o. male  History of present illness:  Richard Johnson is a 71 year old right-handed white male with a history of well-controlled seizures.  The patient is on brand-name Dilantin, but he is finding that the medications are becoming more expensive for him to purchase.  The patient has not had any seizures since last seen.  Over the last year he has had some mild changes in sensation in his feet, over the last 3 months he has had some mild changes in balance.  He has not had any falls.  He feels unsteady when he closes his eyes in the shower.  The numbness in the feet is up to the ankle, he feels as if there is plastic or leather between his foot and the floor.  He does not have any significant discomfort in the feet.  He does have some neck pain and some discomfort down the arms off and on.  If the overuses his arms, the pain is worse.  He returns to the office today for an evaluation.  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   ANTERIOR CERVICAL DECOMP/DISCECTOMY FUSION  2017   APPENDECTOMY     CHOLECYSTECTOMY  1989   open   HIP ARTHROPLASTY Bilateral    KNEE ARTHROSCOPY  2001   left   LUMBAR FUSION  2001   Lumber 3-4, 4-5 with rods   REVERSE SHOULDER ARTHROPLASTY  02/19/2012   Procedure: REVERSE SHOULDER ARTHROPLASTY;  Surgeon: Verlee Rossetti, MD;  Location: [facility] OR;  Service: Orthopedics;  Laterality: Right;  right total reverse arthroplasty   TONSILLECTOMY      Family History  Problem Relation Age of  Onset   Stroke Mother    Parkinson's disease Father    Lung cancer Brother     Social history:  reports that he has never smoked. He has never used smokeless tobacco. He reports that he does not drink alcohol and does not use drugs.    Allergies  Allergen Reactions   Contrast Media [Iodinated Diagnostic Agents] Anaphylaxis   Dilaudid [Hydromorphone Hcl]     Severe anxiety   Mysoline [Primidone]     Causes skin to peel off   Penicillins Other (See Comments)    childhoold    Phenobarbital     Causes skin to peel off    Medications:  Prior to Admission medications   Medication Sig Start Date End Date Taking? Authorizing Provider  atorvastatin (LIPITOR) 80 MG tablet Take 80 mg by mouth daily at 6 PM.  07/16/15  Yes [provider]  clopidogrel (PLAVIX) 75 MG tablet Take 75 mg by mouth daily.   Yes [provider]  DILANTIN 100 MG ER capsule Take 2 capsules in the morning, 3 in the evening. Brand medically necessary 01/24/20  Yes Richard Salvo, NP  escitalopram (LEXAPRO) 20 MG tablet Take 20 mg by mouth at bedtime.  02/07/16  Yes [provider]  methocarbamol (ROBAXIN) 750 MG  tablet TAKE ONE TABLET BY MOUTH EVERY 8 HOURS AS NEEDED FOR SPASMS FOR 30 DAYS 09/23/18  Yes [provider]  metoprolol succinate (TOPROL-XL) 25 MG 24 hr tablet Take 50 mg daily by mouth.    Yes [provider]  oxycodone (OXY-IR) 5 MG capsule Take 5 mg by mouth every 12 (twelve) hours.  07/03/13  Yes [provider]    ROS:  Out of a complete 14 system review of symptoms, the patient complains only of the following symptoms, and all other reviewed systems are negative.  Mild balance changes Neck pain, arm pain History of seizures  Blood pressure (!) 147/79, pulse 64, height 5\' 8"  (1.727 m), weight 273 lb 12.8 oz (124.2 kg).  Physical Exam  General: The patient is alert and cooperative at the time of the examination.  The patient is markedly  obese.  Skin: No significant peripheral edema is noted.   Neurologic Exam  Mental status: The patient is alert and oriented x 3 at the time of the examination. The patient has apparent normal recent and remote memory, with an apparently normal attention span and concentration ability.   Cranial nerves: Facial symmetry is present. Speech is normal, no aphasia or dysarthria is noted. Extraocular movements are full. Visual fields are full.  Motor: The patient has good strength in all 4 extremities, with exception of 4 -/5 strength with hip flexion bilaterally.  Sensory examination: Soft touch sensation is symmetric on the face, arms, and legs.  The patient may have a stocking pattern pinprick sensory deficit across the ankles bilaterally, vibration sensation is decreased on the left foot as compared to the right.  Position sense is mildly decreased in both feet.  Coordination: The patient has good finger-nose-finger and heel-to-shin bilaterally.  Gait and station: The patient has a normal gait. Tandem gait is slightly unsteady. Romberg is negative. No drift is seen.  Reflexes: Deep tendon reflexes are symmetric, but are slightly depressed.   Assessment/Plan:  1.  History of seizures, well controlled  2.  Mild gait instability  The patient reports some changes in gait stability, he may be developing a mild peripheral neuropathy.  We will check blood work today.  I will get him set up for nerve conduction studies on both legs, EMG on 1 leg.  He will follow-up in about 1 year, in the future he may be followed through Dr. .  If the nerve conduction studies do not show evidence of a peripheral neuropathy, we may consider MRI of the cervical spine.  He has had a recent Dilantin level, level was 15.5.  A prescription for his Dilantin will be sent in.  Teresa Coombs MD 02/04/2021 10:57 AM  [facility] [address] [place], [place]  Phone  [phone] Fax [phone]

## 2021-02-09 LAB — MULTIPLE MYELOMA PANEL, SERUM
Albumin SerPl Elph-Mcnc: 3.6 g/dL (ref 2.9–4.4)
Albumin/Glob SerPl: 1.1 (ref 0.7–1.7)
Alpha 1: 0.3 g/dL (ref 0.0–0.4)
Alpha2 Glob SerPl Elph-Mcnc: 0.9 g/dL (ref 0.4–1.0)
B-Globulin SerPl Elph-Mcnc: 1.4 g/dL — ABNORMAL HIGH (ref 0.7–1.3)
Gamma Glob SerPl Elph-Mcnc: 1 g/dL (ref 0.4–1.8)
Globulin, Total: 3.5 g/dL (ref 2.2–3.9)
IgA/Immunoglobulin A, Serum: 603 mg/dL — ABNORMAL HIGH (ref 61–437)
IgG (Immunoglobin G), Serum: 1192 mg/dL (ref 603–1613)
IgM (Immunoglobulin M), Srm: 55 mg/dL (ref 20–172)
Total Protein: 7.1 g/dL (ref 6.0–8.5)

## 2021-02-09 LAB — VITAMIN B12: Vitamin B-12: 301 pg/mL (ref 232–1245)

## 2021-02-09 LAB — SEDIMENTATION RATE: Sed Rate: 66 mm/hr — ABNORMAL HIGH (ref 0–30)

## 2021-02-09 LAB — LYME DISEASE SEROLOGY W/REFLEX: Lyme Total Antibody EIA: NEGATIVE

## 2021-02-09 LAB — ANGIOTENSIN CONVERTING ENZYME: Angio Convert Enzyme: 15 U/L (ref 14–82)

## 2021-02-09 LAB — ANA W/REFLEX: Anti Nuclear Antibody (ANA): NEGATIVE

## 2021-02-09 LAB — COPPER, SERUM: Copper: 143 ug/dL — ABNORMAL HIGH (ref 69–132)

## 2021-03-03 ENCOUNTER — Other Ambulatory Visit: Payer: Self-pay | Admitting: Neurology

## 2021-04-03 ENCOUNTER — Encounter: Payer: Medicare Other | Admitting: Diagnostic Neuroimaging

## 2021-06-26 ENCOUNTER — Other Ambulatory Visit: Payer: Self-pay | Admitting: Neurology

## 2022-02-09 NOTE — Progress Notes (Unsigned)
Patient: Richard Johnson. Date of Birth: Mar 15, 1951  Reason for Visit: Follow up History from: Patient Primary Neurologist: Willis/[facility]ara  ASSESSMENT AND PLAN 71 y.o. year old male   1.  History of seizures -He has successfully transitioned from brand-name Dilantin to generic, the dose was reduced to 200 mg  twice daily without issue -I will check a Dilantin level today -His primary care doctor has been refilling his Dilantin -He will follow-up here in 1 year or sooner if needed, he will see Dr. Teresa Coombs in 1 year to establish care   HISTORY OF PRESENT ILLNESS: Today 02/10/22 Richard Johnson is here today for follow-up. Within the last year he switched to generic Dilantin, due to cost. He has done well with this. He did reduce his dosing to 200 mg twice daily. No seizures. His last seizure was about 15 years ago. He has had grand mal seizures in the past. He is retired Scientist, clinical (histocompatibility and immunogenetics). Last visit Dr. Anne Hahn ordered EMG for gait instability. He decided to hold off. He feels overall condition related to arthritis, spinal degeneration. He hasn't had any falls, he can lose balance. We talked about rechecking sed rate, he prefers to hold off. He is on oxycodone from pcp. He still lives in [place], prefers to come here for medical care.   HISTORY  02/04/21 Dr. Anne Hahn: Richard Johnson is a 71 year old right-handed white male with a history of well-controlled seizures.  The patient is on brand-name Dilantin, but he is finding that the medications are becoming more expensive for him to purchase.  The patient has not had any seizures since last seen.  Over the last year he has had some mild changes in sensation in his feet, over the last 3 months he has had some mild changes in balance.  He has not had any falls.  He feels unsteady when he closes his eyes in the shower.  The numbness in the feet is up to the ankle, he feels as if there is plastic or leather between his foot and the floor.  He does not have any significant  discomfort in the feet.  He does have some neck pain and some discomfort down the arms off and on.  If the overuses his arms, the pain is worse.  He returns to the office today for an evaluation.  REVIEW OF SYSTEMS: Out of a complete 14 system review of symptoms, the patient complains only of the following symptoms, and all other reviewed systems are negative.  See HPI  ALLERGIES: Allergies  Allergen Reactions   Contrast Media [Iodinated Contrast Media] Anaphylaxis   Dilaudid [Hydromorphone Hcl]     Severe anxiety   Mysoline [Primidone]     Causes skin to peel off   Penicillins Other (See Comments)    childhoold    Phenobarbital     Causes skin to peel off    HOME MEDICATIONS: Outpatient Medications Prior to Visit  Medication Sig Dispense Refill   atorvastatin (LIPITOR) 80 MG tablet Take 80 mg by mouth daily at 6 PM.      clopidogrel (PLAVIX) 75 MG tablet Take 75 mg by mouth daily.     DILANTIN 100 MG ER capsule TAKE TWO CAPSULES BY MOUTH EVERY MORNING THEN TAKE THREE CAPSULES BY MOUTH EVERY EVENING (Patient taking differently: TAKE TWO CAPSULES BY MOUTH EVERY MORNING THEN TAKE TWO CAPSULES BY MOUTH EVERY EVENING) 450 capsule 1   escitalopram (LEXAPRO) 20 MG tablet Take 20 mg by mouth at bedtime.  methocarbamol (ROBAXIN) 750 MG tablet TAKE ONE TABLET BY MOUTH EVERY 8 HOURS AS NEEDED FOR SPASMS FOR 30 DAYS     metoprolol succinate (TOPROL-XL) 25 MG 24 hr tablet Take 50 mg daily by mouth.      oxycodone (OXY-IR) 5 MG capsule Take 5 mg by mouth every 6 (six) hours as needed.     No facility-administered medications prior to visit.    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   ANTERIOR CERVICAL DECOMP/DISCECTOMY FUSION  2017   APPENDECTOMY     CHOLECYSTECTOMY  1989   open   HIP ARTHROPLASTY Bilateral    KNEE ARTHROSCOPY  2001   left   LUMBAR FUSION  2001   Lumber 3-4, 4-5 with rods   REVERSE SHOULDER ARTHROPLASTY  02/19/2012   Procedure: REVERSE SHOULDER ARTHROPLASTY;  Surgeon: Augustin Schooling, MD;  Location: [facility];  Service: Orthopedics;  Laterality: Right;  right total reverse arthroplasty   TONSILLECTOMY      FAMILY HISTORY: Family History  Problem Relation Age of Onset   Stroke Mother    Parkinson's disease Father    Lung cancer Brother     SOCIAL HISTORY: Social History   Socioeconomic History   Marital status: Married    Spouse name: Not on file   Number of children: 2   Years of education: College   Highest education level: Not on file  Occupational History   Occupation: Ecologist  Tobacco Use   Smoking status: Never   Smokeless tobacco: Never  Substance and Sexual Activity   Alcohol use: No    Alcohol/week: 0.0 standard drinks of alcohol   Drug use: No   Sexual activity: Not on file  Other Topics Concern   Not on file  Social History Narrative   Patient is married Richard Johnson) and lives with his wife.     Patient has two one child deceased.    Patient is a Equities trader. Retired Immunologist.   Patient has a Public affairs consultant.   Patient is right-handed.      Social Determinants of Health   Financial Resource Strain: Not on file  Food Insecurity: Not on file  Transportation Needs: Not on file  Physical Activity: Not on file  Stress: Not on file  Social Connections: Not on file  Intimate Partner Violence: Not on file    PHYSICAL EXAM  Vitals:   02/10/22 1117  BP: 139/84  Pulse: 85  Weight: 264 lb (119.7 kg)  Height: 5\' 8"  (1.727 m)   Body mass index is 40.14  kg/m.  Generalized: Well developed, in no acute distress  Neurological examination  Mentation: Alert oriented to time, place, history taking. Follows all commands speech and language fluent Cranial nerve II-XII: Pupils were equal round reactive to light. Extraocular movements were full, visual field were full on confrontational test. Facial sensation and strength were normal. Head turning and shoulder shrug  were normal and symmetric. Motor: The motor testing reveals 5 over 5 strength of all 4  extremities. Good symmetric motor tone is noted throughout.  Sensory: Sensory testing is intact to soft touch on all 4 extremities. No evidence of extinction is noted.  Coordination: Cerebellar testing reveals good finger-nose-finger and heel-to-shin bilaterally.  Gait and station: Gait is normal.  Reflexes: Deep tendon reflexes are symmetric but decreased  DIAGNOSTIC DATA (LABS, IMAGING, TESTING) - I reviewed patient records, labs, notes, testing and imaging myself where available.  Lab Results  Component Value Date   WBC 8.8 01/24/2020   HGB 13.8 01/24/2020   HCT 41.2 01/24/2020   MCV 96 01/24/2020   PLT 274 01/24/2020      Component Value Date/Time   NA 141 01/24/2020 1415   K 4.7 01/24/2020 1415   CL 103 01/24/2020 1415   CO2 26 01/24/2020 1415   GLUCOSE 103 (H) 01/24/2020 1415   GLUCOSE 142 (H) 02/20/2012 0630   BUN 16 01/24/2020 1415   CREATININE 0.92 01/24/2020 1415   CALCIUM 9.2 01/24/2020 1415   PROT 7.1 02/04/2021 1124   ALBUMIN 4.3 01/24/2020 1415   AST 21 01/24/2020 1415   ALT 25 01/24/2020 1415   ALKPHOS 118 01/24/2020 1415   BILITOT <0.2 01/24/2020 1415   GFRNONAA 85 01/24/2020 1415   GFRAA 98 01/24/2020 1415   No results found for: "CHOL", "HDL", "LDLCALC", "LDLDIRECT", "TRIG", "CHOLHDL" No results found for: "HGBA1C" Lab Results  Component Value Date   VITAMINB12 301 02/04/2021   Lab Results  Component Value Date   TSH 1.990 03/18/2016   Margie Ege, AGNP-C,  DNP 02/10/2022, 11:21 AM [facility] [address], [address] [place], [place] [address] [phone]

## 2022-02-10 ENCOUNTER — Encounter: Payer: Self-pay | Admitting: Neurology

## 2022-02-10 ENCOUNTER — Ambulatory Visit (INDEPENDENT_AMBULATORY_CARE_PROVIDER_SITE_OTHER): Payer: Medicare Other | Admitting: Neurology

## 2022-02-10 VITALS — BP 139/84 | HR 85 | Ht 68.0 in | Wt 264.0 lb

## 2022-02-10 DIAGNOSIS — G40309 Generalized idiopathic epilepsy and epileptic syndromes, not intractable, without status epilepticus: Secondary | ICD-10-CM

## 2022-02-10 NOTE — Patient Instructions (Signed)
Great to see you today  Continue Dilantin  We will check a Dilantin level  We will see you back in 1 year to meet Dr. April Manson

## 2022-02-11 LAB — PHENYTOIN LEVEL, TOTAL: Phenytoin (Dilantin), Serum: 6.6 ug/mL — ABNORMAL LOW (ref 10.0–20.0)

## 2023-01-27 ENCOUNTER — Ambulatory Visit: Payer: Medicare Other | Admitting: Neurology
